# Patient Record
Sex: Male | Born: 1955 | Race: White | Hispanic: No | Smoking: Former smoker
Health system: Southern US, Community
[De-identification: ages and names within clinical notes are randomized; demographics above are authoritative.]

## PROBLEM LIST (undated history)

## (undated) DIAGNOSIS — Z9889 Other specified postprocedural states: Secondary | ICD-10-CM

## (undated) DIAGNOSIS — Z87442 Personal history of urinary calculi: Secondary | ICD-10-CM

## (undated) DIAGNOSIS — K635 Polyp of colon: Secondary | ICD-10-CM

## (undated) DIAGNOSIS — E785 Hyperlipidemia, unspecified: Secondary | ICD-10-CM

## (undated) DIAGNOSIS — I83813 Varicose veins of bilateral lower extremities with pain: Secondary | ICD-10-CM

## (undated) DIAGNOSIS — C801 Malignant (primary) neoplasm, unspecified: Secondary | ICD-10-CM

## (undated) DIAGNOSIS — N200 Calculus of kidney: Secondary | ICD-10-CM

## (undated) HISTORY — DX: Calculus of kidney: N20.0

---

## 2004-06-22 ENCOUNTER — Ambulatory Visit: Payer: Self-pay

## 2009-05-15 DIAGNOSIS — Z720 Tobacco use: Secondary | ICD-10-CM | POA: Insufficient documentation

## 2012-05-10 HISTORY — PX: HAND SURGERY: SHX662

## 2012-12-06 ENCOUNTER — Emergency Department: Payer: Self-pay | Admitting: Emergency Medicine

## 2012-12-07 ENCOUNTER — Ambulatory Visit: Payer: Self-pay | Admitting: Orthopedic Surgery

## 2013-02-15 ENCOUNTER — Ambulatory Visit: Payer: Self-pay | Admitting: Orthopedic Surgery

## 2013-03-22 ENCOUNTER — Ambulatory Visit: Payer: Self-pay | Admitting: Orthopedic Surgery

## 2014-08-30 NOTE — Op Note (Signed)
PATIENT NAME:  Brandon GrebeBOWES, Edinson T MR#:  [id number] DATE OF BIRTH:  1955-12-12  DATE OF PROCEDURE:  03/22/2013  PREOPERATIVE DIAGNOSIS:  Painful hardware, left hand.   POSTOPERATIVE DIAGNOSIS:  Painful hardware, left hand.  PROCEDURE:  Removal of deep hardware, left hand.   ANESTHESIA:  General.   SURGEON:  Kennedy BuckerMichael Avital Dancy, M.D.   DESCRIPTION OF PROCEDURE:  The patient was brought to the Operating Room and after adequate general anesthesia was obtained, the left arm was prepped and draped in the usual sterile fashion with a tourniquet applied to the upper arm.  After appropriate patient identification and timeout procedures were completed the prior incision between the second and third metacarpals was utilized.  Incision carried down through the skin and subcutaneous tissue.  Inspection revealed significant scar tissue adhering around the extensor tendons of the second and third fingers.  The extensor communis tendon of the middle finger was separated from the scar tissue first and then off of the underlying plate.  Next, the two tendons to the index finger were also released from their encasing scar tissue as well as over the plate, deep to them at the second metacarpal.  Both plates were exposed and all screws removed without difficulty along with the plates.  Fractures were healed.  The wound was irrigated and inspection revealed improved flexion of both the MCP joints of both the index and middle fingers by approximately 15 to 20 degrees following release.  Passive flexion gave 1 to 2 cm from fingertip to palm measurement.  The wounds were thoroughly irrigated.  Local anesthetic was infiltrated in the subcutaneous tissue to aid in postoperative analgesia, 10 mL of 0.5% Sensorcaine without epinephrine.  The wound was closed with 4-0 nylon.  Xeroform, 4 x 4's, Webril and a dorsal splint holding the MCPs in maximal flexion were applied.  The patient was sent to the recovery room in stable condition.    ESTIMATED BLOOD LOSS:  Minimal.   COMPLICATIONS:  None.   SPECIMEN:  None.   Hardware given to patient per his request.   TOURNIQUET TIME:  41 minutes at 250 mmHg.    ____________________________ Leitha SchullerMichael J. Ambrose Wile, MD mjm:ea D: 03/22/2013 18:52:24 ET T: 03/23/2013 03:53:29 ET JOB#: [id number]  cc: Leitha SchullerMichael J. Troi Bechtold, MD, <Dictator> Leitha SchullerMICHAEL J Nyimah Shadduck MD ELECTRONICALLY SIGNED 03/23/2013 8:01

## 2014-08-30 NOTE — Op Note (Signed)
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   PROCEDURE: Left carpal tunnel release and ulnar nerve release at the wrist, Guyon canal.   ANESTHESIA: General.   SURGEON: Kennedy BuckerMichael Careen Mauch, M.D.   DESCRIPTION OF PROCEDURE: The patient was brought to the operating room and after adequate anesthesia was obtained, the left arm was prepped and draped in the usual sterile fashion with a tourniquet applied to the upper arm. After appropriate patient identification and timeout procedures were completed, the tourniquet raised to 250 mmHg.   An incision was made in line with the ring metacarpal and the skin and subcutaneous tissue were  divided to get down to the transverse carpal ligament. Going just ulnar to this, the Guyon canal was opened and the ulnar artery and nerve followed to the abductor musculature. There did appear to be some compression in the distal portion of the canal with release obtained.   Next, the carpal tunnel was opened just radial to this level of Guyon canal with the transverse carpal ligament incised along its length. There appeared to be compression in the more distal portion of the canal with blanching of the nerve. There did appear to be complete release with the transcarpal ligament release with some vascular blush to the nerve.   The wound was irrigated and then closed with simple interrupted 4-0 nylon skin sutures, and 10 mL of 0.5% Sensorcaine without epinephrine was infiltrated for postoperative analgesia. Sterile dressing of Xeroform, 4 x 4's, Webril and Ace wrap were applied. Tourniquet time was 24 minutes at 250 mmHg. There were no complications and no specimen.   ____________________________ Leitha SchullerMichael J. Zhi Geier,  MD mjm:np D: 02/15/2013 18:54:00 ET T: 02/15/2013 22:41:51 ET JOB#: [id number]  cc: Leitha SchullerMichael J. Alekxander Isola, MD, <Dictator> Leitha SchullerMICHAEL J Matisse Roskelley MD ELECTRONICALLY SIGNED 02/16/2013 8:00

## 2014-08-30 NOTE — Op Note (Signed)
PATIENT NAME:  Brandon Shields, Brandon Shields MR#:  [id number] DATE OF BIRTH:  [birth year]  DATE OF PROCEDURE:  12/07/2012  PREOPERATIVE DIAGNOSIS: Displaced second and third metacarpal shaft fractures.   POSTOPERATIVE DIAGNOSIS: Displaced second and third metacarpal shaft fractures.   PROCEDURE: Open reduction and internal fixation of left second and third metacarpals.   ANESTHESIA: General.   SURGEON: Leitha SchullerMichael J. Asencion Guisinger, M.D.   DESCRIPTION OF PROCEDURE: The patient was brought to the operating room and after adequate anesthesia was obtained, the left arm was prepped and draped in the usual sterile fashion with a tourniquet applied to the upper arm. After appropriate patient identification and timeout procedures were completed, the tourniquet was elevated to 250 mmHg. Incision was made in between the second and third metacarpals, and the juncturae tendinum was split and subsequently repaired. The second metacarpal was approached first with stripping of these down to the bone of the shaft and then going distally. The fracture could be reduced with flexion of the MCP and IP joints. With the fracture reduced, a locking 2.5 straight plate was cut, a 5-hole plate applied and attached with K-wires. C-arm showed good visualization of the fracture line and appropriate position of the plate. A locking screw was placed on the proximal fragment and then a nonlocking screw through the most distal screw hole to compress the plate against the bone. This gave essentially anatomic alignment with minimal gap at the fracture site. The remaining screws were then filled using standard technique, drilling through the fast guide and then placing the locking screws. The initial nonlocking screw was then replaced with a locking screw to add to rigidity. Next, the third metacarpal was exposed in a similar fashion through the same incision. It was a more proximal fracture and relatively easy to align with the indirect reduction created by the  second metacarpal which was quite displaced. A 6-hole plate was applied with again K-wires through the fast guides holding the plate in place. A locking screw was placed proximally, drilling, measuring and placing the locking screw, and then a compression screw was placed through the distal part of the plate with a washer in place. Additional locking screws were then placed, including a locking screw through compression hole on the proximal end. There appeared to be essentially anatomic alignment on examination. Rotation was stable. On stressing the fifth and fourth metacarpals, there was no displacement of the fractures in these metacarpals, with AP and lateral projections showing no screw penetration into the volar aspect of the hand or into the flexor tendon. The wound was thoroughly irrigated. A 4-0 Vicryl suture was used to repair the juncturae between the extensor tendons. Of note, there was extensive soft tissue injury to the subcutaneous tissue from the prior crush injury. The wound was then closed with 4-0 Vicryl subcutaneously and 4-0 nylon for the skin in a simple interrupted fashion. The wound was thoroughly irrigated prior to this, and 0.5% Sensorcaine was infiltrated for postoperative analgesia proximal to the incision. Sterile dressings of Xeroform, 4 x 4, Webril and a volar splint were applied. Biomet 2.5 locking straight plate was used.   TOURNIQUET TIME: 94 minutes at 250 mmHg.   CONDITION: To recovery room stable.   ____________________________ Leitha SchullerMichael J. Evelyna Folker, MD mjm:gb D: 12/07/2012 23:13:33 ET Shields: 12/07/2012 23:39:06 ET JOB#: [id number]  cc: Leitha SchullerMichael J. Jari Carollo, MD, <Dictator> Leitha SchullerMICHAEL J Allisa Einspahr MD ELECTRONICALLY SIGNED 12/08/2012 12:52

## 2014-12-18 DIAGNOSIS — E785 Hyperlipidemia, unspecified: Secondary | ICD-10-CM | POA: Insufficient documentation

## 2016-10-25 ENCOUNTER — Encounter (INDEPENDENT_AMBULATORY_CARE_PROVIDER_SITE_OTHER): Payer: PRIVATE HEALTH INSURANCE | Admitting: Vascular Surgery

## 2016-11-01 ENCOUNTER — Encounter (INDEPENDENT_AMBULATORY_CARE_PROVIDER_SITE_OTHER): Payer: Self-pay | Admitting: Vascular Surgery

## 2016-11-01 ENCOUNTER — Ambulatory Visit (INDEPENDENT_AMBULATORY_CARE_PROVIDER_SITE_OTHER): Payer: PRIVATE HEALTH INSURANCE | Admitting: Vascular Surgery

## 2016-11-01 VITALS — BP 151/91 | HR 77 | Resp 16 | Ht 73.0 in | Wt 261.0 lb

## 2016-11-01 DIAGNOSIS — Z720 Tobacco use: Secondary | ICD-10-CM

## 2016-11-01 DIAGNOSIS — E785 Hyperlipidemia, unspecified: Secondary | ICD-10-CM | POA: Diagnosis not present

## 2016-11-01 DIAGNOSIS — M79604 Pain in right leg: Secondary | ICD-10-CM

## 2016-11-01 DIAGNOSIS — M79605 Pain in left leg: Secondary | ICD-10-CM

## 2016-11-01 DIAGNOSIS — I83813 Varicose veins of bilateral lower extremities with pain: Secondary | ICD-10-CM | POA: Insufficient documentation

## 2016-11-01 NOTE — Progress Notes (Signed)
Subjective:    Patient ID: Brandon Shields., male    DOB: 18-Mar-1956, 61 y.o.   MRN: [id number] Chief Complaint  Patient presents with  . New Evaluation    Claudication   Presents as a new patient referred by Dr. Quillian Quince for evaluation of "painful lower extremity". Patient endorses a history of bilateral hip pain which radiates down to his ankle. This occurs bilaterally however the left lower extremity is more affected. The patient had a recent episode where this discomfort was very intense, this prompted him to seek medical attention. During his physical, the clinician was unable to palpate his DP pulses. The patient says this discomfort worsens with activity. The patient also endorses a history of painful varicose veins located on the bilateral lower extremity. States the discomfort along his varicose veins worsens as the day prolongs. The patient job requires him to stand and walk for long periods of time. Patient denies any trauma or recent surgical intervention to his lower extremities. Patient denies any history of DVT. Patient denies any rest pain or ulceration to his lower extremity. Patient denies any fever, nausea or vomiting.   Review of Systems  Constitutional: Negative.   HENT: Negative.   Eyes: Negative.   Respiratory: Negative.   Cardiovascular: Positive for leg swelling.       Painful varicose veins  Gastrointestinal: Negative.   Endocrine: Negative.   Genitourinary: Negative.   Musculoskeletal: Negative.   Skin: Negative.   Allergic/Immunologic: Negative.   Neurological: Negative.   Hematological: Negative.   Psychiatric/Behavioral: Negative.       Objective:   Physical Exam  Constitutional: He is oriented to person, place, and time. He appears well-developed and well-nourished. No distress.  HENT:  Head: Normocephalic and atraumatic.  Eyes: Conjunctivae are normal. Pupils are equal, round, and reactive to light.  Neck: Normal range of motion.  Cardiovascular:  Normal rate, regular rhythm, normal heart sounds and intact distal pulses.   Pulses:      Radial pulses are 2+ on the right side, and 2+ on the left side.       Dorsalis pedis pulses are 0 on the right side, and 0 on the left side.       Posterior tibial pulses are 2+ on the right side, and 2+ on the left side.  Pulmonary/Chest: Effort normal.  Musculoskeletal: Normal range of motion. He exhibits edema (Mild bilateral edema noted).  Neurological: He is alert and oriented to person, place, and time.  Skin: He is not diaphoretic.  Greater than 1 cm varicose veins noted bilaterally Scattered spider veins noted bilaterally  Psychiatric: He has a normal mood and affect. His behavior is normal. Judgment and thought content normal.  Vitals reviewed.  BP (!) 151/91 (BP Location: Right Arm)   Pulse 77   Resp 16   Ht 6\' 1"  (1.854 m)   Wt 261 lb (118.4 kg)   BMI 34.43 kg/m   No past medical history on file.  Social History   Social History  . Marital status: Divorced    Spouse name: N/A  . Number of children: N/A  . Years of education: N/A   Occupational History  . Not on file.   Social History Main Topics  . Smoking status: Former Games developer  . Smokeless tobacco: Never Used     Comment: quit 31 years ago  . Alcohol use Yes     Comment: beer  . Drug use: No  . Sexual activity: Not on  file   Other Topics Concern  . Not on file   Social History Narrative  . No narrative on file   Past Surgical History:  Procedure Laterality Date  . HAND SURGERY  2014   Family History  Problem Relation Age of Onset  . Psoriasis Mother   . Diabetes Father   . COPD Father   . Diabetes Sister   . Psoriasis Brother    Allergies  Allergen Reactions  . Codeine Rash      Assessment & Plan:  Presents as a new patient referred by Dr. Quillian QuinceBliss for evaluation of "painful lower extremity". Patient endorses a history of bilateral hip pain which radiates down to his ankle. This occurs bilaterally  however the left lower extremity is more affected. The patient had a recent episode where this discomfort was very intense, this prompted him to seek medical attention. During his physical, the clinician was unable to palpate his DP pulses. The patient says this discomfort worsens with activity. The patient also endorses a history of painful varicose veins located on the bilateral lower extremity. States the discomfort along his varicose veins worsens as the day prolongs. The patient job requires him to stand and walk for long periods of time. Patient denies any trauma or recent surgical intervention to his lower extremities. Patient denies any history of DVT. Patient denies any rest pain or ulceration to his lower extremity. Patient denies any fever, nausea or vomiting.  1. Lower extremity pain, bilateral - New Patient with risk factors for PAD Patient Nicole CellaBarrens is pain with activity Able to palpate bilateral PT pulses however DP pulses are not palpable Patient's pain most likely DJD or neuropathic however order a baseline ABI to assess if any PAD is contributing peripheral artery disease and  - VAS US ABI WITH/WO TBI; Future  2. Varicose veins of both lower extremities with pain - New The patient was encouraged to wear graduated compression stockings (20-30 mmHg) on a daily basis. The patient was instructed to begin wearing the stockings first thing in the morning and removing them in the evening. The patient was instructed specifically not to sleep in the stockings. Prescription given. In addition, behavioral modification including elevation during the day will be initiated. Anti-inflammatories for pain. The patient will follow up in three months to asses conservative management.  Information on chronic venous insufficiency and compression stockings was given to the patient. The patient was instructed to call the office in the interim if any worsening edema or ulcerations to the legs, feet or toes  occurs. The patient expresses their understanding.  - VAS US LOWER EXTREMITY VENOUS REFLUX; Future  3. Hyperlipidemia, unspecified hyperlipidemia type - Stable Encouraged good control as its slows the progression of atherosclerotic disease  4. Tobacco use - Stable I have discussed (approximately 5 minutes) with the patient the role of tobacco in the pathogenesis of atherosclerosis and its effect on the progression of the disease, impact on the durability of interventions and its limitations on the formation of collateral pathways. I have recommended absolute tobacco cessation. I have discussed various options available for assistance with tobacco cessation including over the counter methods (Nicotine gum, patch and lozenges). We also discussed prescription options (Chantix, Nicotine Inhaler / Nasal Spray). The patient is not interested in pursuing any prescription tobacco cessation options at this time. The patient voices their understanding.   No current outpatient prescriptions on file prior to visit.   No current facility-administered medications on file prior to visit.  There are no Patient Instructions on file for this visit. No Follow-up on file.   Raeford Brandenburg A Roney Youtz, PA-C

## 2016-12-23 ENCOUNTER — Ambulatory Visit (INDEPENDENT_AMBULATORY_CARE_PROVIDER_SITE_OTHER): Payer: PRIVATE HEALTH INSURANCE

## 2016-12-23 ENCOUNTER — Ambulatory Visit (INDEPENDENT_AMBULATORY_CARE_PROVIDER_SITE_OTHER): Payer: PRIVATE HEALTH INSURANCE | Admitting: Vascular Surgery

## 2016-12-23 ENCOUNTER — Encounter (INDEPENDENT_AMBULATORY_CARE_PROVIDER_SITE_OTHER): Payer: Self-pay | Admitting: Vascular Surgery

## 2016-12-23 DIAGNOSIS — I83813 Varicose veins of bilateral lower extremities with pain: Secondary | ICD-10-CM

## 2016-12-23 DIAGNOSIS — I872 Venous insufficiency (chronic) (peripheral): Secondary | ICD-10-CM

## 2016-12-23 DIAGNOSIS — M79604 Pain in right leg: Secondary | ICD-10-CM

## 2016-12-23 DIAGNOSIS — M79605 Pain in left leg: Secondary | ICD-10-CM | POA: Diagnosis not present

## 2016-12-23 DIAGNOSIS — I83812 Varicose veins of left lower extremities with pain: Secondary | ICD-10-CM

## 2016-12-23 DIAGNOSIS — E785 Hyperlipidemia, unspecified: Secondary | ICD-10-CM

## 2016-12-26 NOTE — Progress Notes (Signed)
MRN : [id number]  Brandon Shields. is a 61 y.o. (Apr 12, 1956) male who presents with chief complaint of  Chief Complaint  Patient presents with  . Follow-up    CONV.ABI.BLE VEN REFLUX   .  History of Present Illness: The patient is seen for follow up of painful lower extremities. Patient notes the pain is variable and not always associated with activity.  The pain is somewhat consistent day to day occurring on most days. The patient notes the pain also occurs with standing and routinely seems worse as the day wears on. The pain has been progressive over the past several years. The patient states these symptoms are causing  a profound negative impact on quality of life and daily activities.  The patient denies rest pain or dangling of an extremity off the side of the bed during the night for relief. No open wounds or sores at this time. No history of DVT or phlebitis. No prior interventions or surgeries.  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    No outpatient prescriptions have been marked as taking for the 12/23/16 encounter (Office Visit) with Gilda Crease, Latina Craver, MD.    History reviewed. No pertinent past medical history.  Past Surgical History:  Procedure Laterality Date  . HAND SURGERY  2014    Social History Social History  Substance Use Topics  . Smoking status: Former Games developer  . Smokeless tobacco: Never Used     Comment: quit 31 years ago  . Alcohol use Yes     Comment: beer    Family History Family History  Problem Relation Age of Onset  . Psoriasis Mother   . Diabetes Father   . COPD Father   . Diabetes Sister   . Psoriasis Brother     Allergies  Allergen Reactions  . Codeine Rash     REVIEW OF SYSTEMS (Negative unless checked)  Constitutional: [] Weight loss  [] Fever  [] Chills Cardiac: [] Chest pain   [] Chest pressure   [] Palpitations   [] Shortness of breath when laying flat   [] Shortness of breath with  exertion. Vascular:  [x] Pain in legs with walking   [x] Pain in legs with standing  [] History of DVT   [] Phlebitis   [] Swelling in legs   [] Varicose veins   [] Non-healing ulcers Pulmonary:   [] Uses home oxygen   [] Productive cough   [] Hemoptysis   [] Wheeze  [] COPD   [] Asthma Neurologic:  [] Dizziness   [] Seizures   [] History of stroke   [] History of TIA  [] Aphasia   [] Vissual changes   [] Weakness or numbness in arm   [] Weakness or numbness in leg Musculoskeletal:   [] Joint swelling   [x] Joint pain   [x] Low back pain Hematologic:  [] Easy bruising  [] Easy bleeding   [] Hypercoagulable state   [] Anemic Gastrointestinal:  [] Diarrhea   [] Vomiting  [] Gastroesophageal reflux/heartburn   [] Difficulty swallowing. Genitourinary:  [] Chronic kidney disease   [] Difficult urination  [] Frequent urination   [] Blood in urine Skin:  [] Rashes   [] Ulcers  Psychological:  [] History of anxiety   []  History of major depression.  Physical Examination  There were no vitals filed for this visit. There is no height or weight on file to calculate BMI. Gen: WD/WN, NAD Head: [place]/AT, No temporalis wasting.  Ear/Nose/Throat: Hearing grossly intact, nares w/o erythema or drainage Eyes: PER, EOMI, sclera nonicteric.  Neck: Supple, no large masses.   Pulmonary:  Good air movement, no audible wheezing bilaterally,  no use of accessory muscles.  Cardiac: RRR, no JVD Vascular: Large varicosities present extensively greater than 10 mm bilaterally.  moderate venous stasis changes to the legs bilaterally.  2+ soft pitting edema Vessel Right Left  Radial Palpable Palpable  PT Not Palpable Trace Palpable  DP Trace Palpable Trace Palpable  Gastrointestinal: Non-distended. No guarding/no peritoneal signs.  Musculoskeletal: M/S 5/5 throughout.  No deformity or atrophy.  Neurologic: CN 2-12 intact. Symmetrical.  Speech is fluent. Motor exam as listed above. Psychiatric: Judgment intact, Mood & affect appropriate for pt's clinical  situation. Dermatologic: venous rashes no ulcers noted.  No changes consistent with cellulitis. Lymph : No lichenification or skin changes of chronic lymphedema.  CBC No results found for: WBC, HGB, HCT, MCV, PLT  BMET No results found for: NA, K, CL, CO2, GLUCOSE, BUN, CREATININE, CALCIUM, GFRNONAA, GFRAA CrCl cannot be calculated (No order found.).  COAG No results found for: INR, PROTIME  Radiology No results found.  Assessment/Plan 1. Lower extremity pain, bilateral Recommend:  I do not find evidence of Vascular pathology that would explain the patient's symptoms  The patient has atypical pain symptoms for vascular disease  Noninvasive studies including venous ultrasound of the legs do not identify vascular problems  The patient should continue walking and begin a more formal exercise program. The patient should continue his antiplatelet therapy and aggressive treatment of the lipid abnormalities. The patient should begin wearing graduated compression socks 15-20 mmHg strength to control her mild edema.  Patient will follow-up with me on a PRN basis  Further work-up of her lower extremity pain is deferred to the primary service     2. Varicose veins of both lower extremities with pain Recommend:  The patient is complaining of varicose veins.    I have had a long discussion with the patient regarding  varicose veins and why they cause symptoms.  Patient will begin wearing graduated compression stockings on a daily basis, beginning first thing in the morning and removing them in the evening. The patient is instructed specifically not to sleep in the stockings.    The patient  will also begin using over-the-counter analgesics such as Motrin 600 mg po TID to help control the symptoms as needed.    In addition, behavioral modification including elevation during the day will be initiated, utilizing a recliner was recommended.  The patient is also instructed to continue  exercising such as walking 4-5 times per week.  At this time the patient wishes to continue conservative therapy and is not interested in more invasive treatments such as laser ablation and sclerotherapy.  The Patient will follow up PRN if the symptoms worsen.  3. Hyperlipidemia, unspecified hyperlipidemia type Continue statin as ordered and reviewed, no changes at this time   4. Chronic venous insufficiency Recommend:  The patient is complaining of varicose veins.    I have had a long discussion with the patient regarding  varicose veins and why they cause symptoms.  Patient will begin wearing graduated compression stockings on a daily basis, beginning first thing in the morning and removing them in the evening. The patient is instructed specifically not to sleep in the stockings.    The patient  will also begin using over-the-counter analgesics such as Motrin 600 mg po TID to help control the symptoms as needed.    In addition, behavioral modification including elevation during the day will be initiated, utilizing a recliner was recommended.  The patient is also instructed to continue exercising  such as walking 4-5 times per week.  At this time the patient wishes to continue conservative therapy and is not interested in more invasive treatments such as laser ablation and sclerotherapy.  The Patient will follow up PRN if the symptoms worsen.    Levora Dredge, MD  12/26/2016 2:04 PM

## 2019-10-27 ENCOUNTER — Other Ambulatory Visit: Payer: Self-pay

## 2019-10-27 ENCOUNTER — Encounter: Payer: Self-pay | Admitting: Emergency Medicine

## 2019-10-27 ENCOUNTER — Emergency Department
Admission: EM | Admit: 2019-10-27 | Discharge: 2019-10-27 | Disposition: A | Discharge status: Home / Self Care | Payer: Commercial Managed Care - PPO | Attending: Emergency Medicine | Admitting: Emergency Medicine

## 2019-10-27 ENCOUNTER — Emergency Department: Payer: Commercial Managed Care - PPO

## 2019-10-27 DIAGNOSIS — N201 Calculus of ureter: Secondary | ICD-10-CM | POA: Diagnosis not present

## 2019-10-27 DIAGNOSIS — Z87891 Personal history of nicotine dependence: Secondary | ICD-10-CM | POA: Diagnosis not present

## 2019-10-27 DIAGNOSIS — R1031 Right lower quadrant pain: Secondary | ICD-10-CM | POA: Diagnosis present

## 2019-10-27 LAB — BASIC METABOLIC PANEL
Anion gap: 10 (ref 5–15)
BUN: 16 mg/dL (ref 8–23)
CO2: 23 mmol/L (ref 22–32)
Calcium: 9.3 mg/dL (ref 8.9–10.3)
Chloride: 105 mmol/L (ref 98–111)
Creatinine, Ser: 0.92 mg/dL (ref 0.61–1.24)
GFR calc Af Amer: >60 mL/min (ref >60)
GFR calc non Af Amer: >60 mL/min (ref >60)
Glucose, Bld: 171 mg/dL — ABNORMAL HIGH (ref 70–99)
Potassium: 4.1 mmol/L (ref 3.5–5.1)
Sodium: 138 mmol/L (ref 135–145)

## 2019-10-27 LAB — URINALYSIS, COMPLETE (UACMP) WITH MICROSCOPIC
Bacteria, UA: NONE SEEN
Bilirubin Urine: NEGATIVE
Glucose, UA: NEGATIVE mg/dL
Ketones, ur: 5 mg/dL — AB
Leukocytes,Ua: NEGATIVE
Nitrite: NEGATIVE
Protein, ur: NEGATIVE mg/dL
Specific Gravity, Urine: 1.015 (ref 1.005–1.030)
Squamous Epithelial / HPF: NONE SEEN (ref 0–5)
pH: 6 (ref 5.0–8.0)

## 2019-10-27 LAB — CBC
HCT: 46.2 % (ref 39.0–52.0)
Hemoglobin: 15.9 g/dL (ref 13.0–17.0)
MCH: 30.3 pg (ref 26.0–34.0)
MCHC: 34.4 g/dL (ref 30.0–36.0)
MCV: 88.2 fL (ref 80.0–100.0)
Platelets: 126 10*3/uL — ABNORMAL LOW (ref 150–400)
RBC: 5.24 MIL/uL (ref 4.22–5.81)
RDW: 12.5 % (ref 11.5–15.5)
WBC: 8.2 10*3/uL (ref 4.0–10.5)
nRBC: 0 % (ref 0.0–0.2)

## 2019-10-27 MED ORDER — LACTATED RINGERS IV BOLUS
1000.0000 mL | Freq: Once | INTRAVENOUS | Status: AC
  Administered 2019-10-27: 1000 mL via INTRAVENOUS

## 2019-10-27 MED ORDER — HYDROCODONE-ACETAMINOPHEN 5-325 MG PO TABS: 1.0000 | ORAL_TABLET | ORAL | 0 refills | Status: DC | PRN

## 2019-10-27 MED ORDER — ONDANSETRON 4 MG PO TBDP: 4.0000 mg | ORAL_TABLET | Freq: Three times a day (TID) | ORAL | 0 refills | Status: DC | PRN

## 2019-10-27 NOTE — ED Notes (Signed)
Pt up to use bathroom 

## 2019-10-27 NOTE — ED Triage Notes (Signed)
Pt presents to ED via [facility] from [facility] with c/o back and groin pain with sudden onset at approx 0600 this AM. Per EMS pt reported feeling like his "balls were being squeezed" and also had painful urination this morning.    20g to L AC 224CBG 176/94 62HR

## 2019-10-27 NOTE — ED Provider Notes (Signed)
[facility] Emergency Department Provider Note   ____________________________________________   First MD Initiated Contact with Patient 10/27/19 1049     (approximate)  I have reviewed the triage vital signs and the nursing notes.   HISTORY  Chief Complaint Back Pain and Groin Pain    HPI Brandon Shields. is a 64 y.o. male with past medical history of hyperlipidemia and smoking who presents to the ED complaining of flank and groin pain.  Patient reports that he had sudden onset of sharp pain in his right flank that radiated towards his right groin around 6 AM this morning.  Pain will come on and be severe for about 30 minutes or so before resolving.  Pain can shoot all the way down to his testicles, where it feels like he is being squeezed when the pain is severe.  He denies any dysuria or hematuria, has felt nauseous and sweaty with these episodes, but has not vomited or had any changes in his bowel movements.  He had similar symptoms a couple of weeks ago, but was never evaluated at that time and denies any history of kidney stones.  He has not noticed any swelling or tender areas to his groin.  He is pain-free at this time.        History reviewed. No pertinent past medical history.  Patient Active Problem List   Diagnosis Date Noted  . 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 Lower extremity pain, bilateral 11/01/2016  . HLD (hyperlipidemia) 12/18/2014  . Tobacco use 05/15/2009    Past Surgical History:  Procedure Laterality Date  . HAND SURGERY  2014    Prior to Admission medications   Medication Sig Start Date End Date Taking? Authorizing Provider  HYDROcodone-acetaminophen (NORCO/VICODIN) 5-325 MG tablet Take 1 tablet by mouth every 4 (four) hours as needed for moderate pain. 10/27/19 10/26/20  Chesley Noon, MD  ondansetron (ZOFRAN ODT) 4 MG disintegrating tablet Take 1 tablet (4 mg total) by mouth every 8 (eight)  hours as needed for nausea or vomiting. 10/27/19   Chesley Noon, MD    Allergies Codeine  Family History  Problem Relation Age of Onset  . Psoriasis Mother   . Diabetes Father   . COPD Father   . Diabetes Sister   . Psoriasis Brother     Social History Social History   Tobacco Use  . Smoking status: Former Games developer  . Smokeless tobacco: Never Used  . Tobacco comment: quit 31 years ago  Substance Use Topics  . Alcohol use: Yes    Comment: beer  . Drug use: No    Review of Systems  Constitutional: No fever/chills Eyes: No visual changes. ENT: No sore throat. Cardiovascular: Denies chest pain. Respiratory: Denies shortness of breath. Gastrointestinal: No abdominal pain.  No nausea, no vomiting.  No diarrhea.  No constipation.  Positive for flank pain. Genitourinary: Negative for dysuria.  Positive for groin pain. Musculoskeletal: Negative for back pain. Skin: Negative for rash. Neurological: Negative for headaches, focal weakness or numbness.  ____________________________________________   PHYSICAL EXAM:  VITAL SIGNS: ED Triage Vitals  Enc Vitals Group     BP 10/27/19 0957 137/85     Pulse Rate 10/27/19 0957 79     Resp 10/27/19 0957 17     Temp 10/27/19 0957 97.7 F (36.5 C)     Temp Source 10/27/19 0957 Oral     SpO2 10/27/19 0957 96 %  Weight 10/27/19 0958 235 lb (106.6 kg)     Height 10/27/19 0958 6\' 1"  (1.854 m)     Head Circumference --      Peak Flow --      Pain Score --      Pain Loc --      Pain Edu? --      Excl. in GC? --     Constitutional: Alert and oriented. Eyes: Conjunctivae are normal. Head: Atraumatic. Nose: No congestion/rhinnorhea. Mouth/Throat: Mucous membranes are moist. Neck: Normal ROM Cardiovascular: Normal rate, regular rhythm. Grossly normal heart sounds. Respiratory: Normal respiratory effort.  No retractions. Lungs CTAB. Gastrointestinal: Soft and nontender. No distention.  No CVA tenderness  bilaterally. Genitourinary: No testicular tenderness or lesions noted.  No inguinal hernias noted bilaterally. Musculoskeletal: No lower extremity tenderness nor edema. Neurologic:  Normal speech and language. No gross focal neurologic deficits are appreciated. Skin:  Skin is warm, dry and intact. No rash noted. Psychiatric: Mood and affect are normal. Speech and behavior are normal.  ____________________________________________   LABS (all labs ordered are listed, but only abnormal results are displayed)  Labs Reviewed  URINALYSIS, COMPLETE (UACMP) WITH MICROSCOPIC - Abnormal; Notable for the following components:      Result Value   Color, Urine YELLOW (*)    APPearance CLEAR (*)    Hgb urine dipstick SMALL (*)    Ketones, ur 5 (*)    All other components within normal limits  BASIC METABOLIC PANEL - Abnormal; Notable for the following components:   Glucose, Bld 171 (*)    All other components within normal limits  CBC - Abnormal; Notable for the following components:   Platelets 126 (*)    All other components within normal limits    PROCEDURES  Procedure(s) performed (including Critical Care):  Procedures   ____________________________________________   INITIAL IMPRESSION / ASSESSMENT AND PLAN / ED COURSE       64 year old male with history of hyperlipidemia and smoking presents to the ED with acute onset of sharp pain radiating from his right flank to his groin coming in waves since this morning.  Episodes of pain are severe when present, but have currently resolved and he has no abdominal tenderness, CVA tenderness, or groin tenderness.  No inguinal hernia or scrotal lesions noted.  At this point, I suspect his symptoms are related to a kidney stone and we will further assess with CT scan.  Lab work thus far is unremarkable, UA pending.  CT scan shows nonobstructing stone at right UVJ, likely the source of patient's pain.  UA shows no evidence of infection and  patient remains pain-free at this time.  He is appropriate for discharge home with outpatient follow-up with urology.  We will prescribe Percocet and Zofran for use as needed and he was counseled to return to the ED for new worsening symptoms, patient agrees with plan.      ____________________________________________   FINAL CLINICAL IMPRESSION(S) / ED DIAGNOSES  Final diagnoses:  Ureterolithiasis     ED Discharge Orders         Ordered    HYDROcodone-acetaminophen (NORCO/VICODIN) 5-325 MG tablet  Every 4 hours PRN     Discontinue  Reprint     10/27/19 1313    ondansetron (ZOFRAN ODT) 4 MG disintegrating tablet  Every 8 hours PRN     Discontinue  Reprint     10/27/19 1313           Note:  This document was  prepared using Systems analyst and may include unintentional dictation errors.   Blake Divine, MD 10/27/19 1341

## 2019-11-05 ENCOUNTER — Other Ambulatory Visit: Payer: Self-pay | Admitting: *Deleted

## 2019-11-05 DIAGNOSIS — N201 Calculus of ureter: Secondary | ICD-10-CM

## 2019-11-05 NOTE — Addendum Note (Signed)
Addended by: Sueanne Margarita on: 11/05/2019 03:24 PM   Modules accepted: Orders

## 2019-11-06 ENCOUNTER — Ambulatory Visit (INDEPENDENT_AMBULATORY_CARE_PROVIDER_SITE_OTHER): Payer: Commercial Managed Care - PPO | Admitting: Urology

## 2019-11-06 ENCOUNTER — Encounter: Payer: Self-pay | Admitting: Urology

## 2019-11-06 ENCOUNTER — Other Ambulatory Visit: Payer: Self-pay

## 2019-11-06 ENCOUNTER — Other Ambulatory Visit
Admission: RE | Admit: 2019-11-06 | Discharge: 2019-11-06 | Disposition: A | Discharge status: Home / Self Care | Payer: Commercial Managed Care - PPO | Attending: Urology | Admitting: Urology

## 2019-11-06 VITALS — BP 170/91 | HR 62 | Ht 73.0 in | Wt 243.0 lb

## 2019-11-06 DIAGNOSIS — N201 Calculus of ureter: Secondary | ICD-10-CM | POA: Insufficient documentation

## 2019-11-06 LAB — URINALYSIS, COMPLETE (UACMP) WITH MICROSCOPIC
Bacteria, UA: NONE SEEN
Bilirubin Urine: NEGATIVE
Glucose, UA: NEGATIVE mg/dL
Hgb urine dipstick: NEGATIVE
Ketones, ur: NEGATIVE mg/dL
Leukocytes,Ua: NEGATIVE
Nitrite: NEGATIVE
Protein, ur: NEGATIVE mg/dL
Specific Gravity, Urine: 1.015 (ref 1.005–1.030)
pH: 6 (ref 5.0–8.0)

## 2019-11-06 NOTE — Patient Instructions (Signed)
Dietary Guidelines to Help Prevent Kidney Stones Kidney stones are deposits of minerals and salts that form inside your kidneys. Your risk of developing kidney stones may be greater depending on your diet, your lifestyle, the medicines you take, and whether you have certain medical conditions. Most people can reduce their chances of developing kidney stones by following the instructions below. Depending on your overall health and the type of kidney stones you tend to develop, your dietitian may give you more specific instructions. What are tips for following this plan? Reading food labels  Choose foods with "no salt added" or "low-salt" labels. Limit your sodium intake to less than 1500 mg per day.  Choose foods with calcium for each meal and snack. Try to eat about 300 mg of calcium at each meal. Foods that contain 200-500 mg of calcium per serving include: ? 8 oz (237 ml) of milk, fortified nondairy milk, and fortified fruit juice. ? 8 oz (237 ml) of kefir, yogurt, and soy yogurt. ? 4 oz (118 ml) of tofu. ? 1 oz of cheese. ? 1 cup (300 g) of dried figs. ? 1 cup (91 g) of cooked broccoli. ? 1-3 oz can of sardines or mackerel.  Most people need 1000 to 1500 mg of calcium each day. Talk to your dietitian about how much calcium is recommended for you. Shopping  Buy plenty of fresh fruits and vegetables. Most people do not need to avoid fruits and vegetables, even if they contain nutrients that may contribute to kidney stones.  When shopping for convenience foods, choose: ? Whole pieces of fruit. ? Premade salads with dressing on the side. ? Low-fat fruit and yogurt smoothies.  Avoid buying frozen meals or prepared deli foods.  Look for foods with live cultures, such as yogurt and kefir. Cooking  Do not add salt to food when cooking. Place a salt shaker on the table and allow each person to add his or her own salt to taste.  Use vegetable protein, such as beans, textured vegetable  protein (TVP), or tofu instead of meat in pasta, casseroles, and soups. Meal planning   Eat less salt, if told by your dietitian. To do this: ? Avoid eating processed or premade food. ? Avoid eating fast food.  Eat less animal protein, including cheese, meat, poultry, or fish, if told by your dietitian. To do this: ? Limit the number of times you have meat, poultry, fish, or cheese each week. Eat a diet free of meat at least 2 days a week. ? Eat only one serving each day of meat, poultry, fish, or seafood. ? When you prepare animal protein, cut pieces into small portion sizes. For most meat and fish, one serving is about the size of one deck of cards.  Eat at least 5 servings of fresh fruits and vegetables each day. To do this: ? Keep fruits and vegetables on hand for snacks. ? Eat 1 piece of fruit or a handful of berries with breakfast. ? Have a salad and fruit at lunch. ? Have two kinds of vegetables at dinner.  Limit foods that are high in a substance called oxalate. These include: ? Spinach. ? Rhubarb. ? Beets. ? Potato chips and french fries. ? Nuts.  If you regularly take a diuretic medicine, make sure to eat at least 1-2 fruits or vegetables high in potassium each day. These include: ? Avocado. ? Banana. ? Orange, prune, carrot, or tomato juice. ? Baked potato. ? Cabbage. ? Beans and split   peas. General instructions   Drink enough fluid to keep your urine clear or pale yellow. This is the most important thing you can do.  Talk to your health care provider and dietitian about taking daily supplements. Depending on your health and the cause of your kidney stones, you may be advised: ? Not to take supplements with vitamin C. ? To take a calcium supplement. ? To take a daily probiotic supplement. ? To take other supplements such as magnesium, fish oil, or vitamin B6.  Take all medicines and supplements as told by your health care provider.  Limit alcohol intake to no  more than 1 drink a day for nonpregnant women and 2 drinks a day for men. One drink equals 12 oz of beer, 5 oz of wine, or 1 oz of hard liquor.  Lose weight if told by your health care provider. Work with your dietitian to find strategies and an eating plan that works best for you. What foods are not recommended? Limit your intake of the following foods, or as told by your dietitian. Talk to your dietitian about specific foods you should avoid based on the type of kidney stones and your overall health. Grains Breads. Bagels. Rolls. Baked goods. Salted crackers. Cereal. Pasta. Vegetables Spinach. Rhubarb. Beets. Canned vegetables. Pickles. Olives. Meats and other protein foods Nuts. Nut butters. Large portions of meat, poultry, or fish. Salted or cured meats. Deli meats. Hot dogs. Sausages. Dairy Cheese. Beverages Regular soft drinks. Regular vegetable juice. Seasonings and other foods Seasoning blends with salt. Salad dressings. Canned soups. Soy sauce. Ketchup. Barbecue sauce. Canned pasta sauce. Casseroles. Pizza. Lasagna. Frozen meals. Potato chips. French fries. Summary  You can reduce your risk of kidney stones by making changes to your diet.  The most important thing you can do is drink enough fluid. You should drink enough fluid to keep your urine clear or pale yellow.  Ask your health care provider or dietitian how much protein from animal sources you should eat each day, and also how much salt and calcium you should have each day. This information is not intended to replace advice given to you by your health care provider. Make sure you discuss any questions you have with your health care provider. Document Revised: 08/16/2018 Document Reviewed: 04/06/2016 Elsevier Patient Education  2020 [employer]  

## 2019-11-06 NOTE — Progress Notes (Signed)
   11/06/19 11:39 AM   Brandon Shields. 06/12/1955 [id number]  CC: Right ureteral stone  HPI: I saw [name] in urology clinic today for a 3 mm right ureteral stone.  He is a very healthy 64 year old male with no prior history of stones who presented to the ED on 10/27/2019 with severe right groin pain, pelvic pressure, and urinary symptoms.  Urinalysis showed microscopic hematuria but no evidence of infection, and CT showed a 3 mm right distal ureteral stone without significant hydronephrosis.  There were small nonobstructing lower pole stones bilaterally.  He was discharged with pain medications.  He reports his right-sided groin pain and bladder symptoms have since completely resolved, and he thinks he passed his stone.  He was not straining his urine.  He denies any hematuria or other urinary symptoms.  Urinalysis today benign with 0-5 WBCs, 0-5 RBCs, no bacteria, nitrite negative.   Surgical History: Past Surgical History:  Procedure Laterality Date  . HAND SURGERY  2014    Family History: Family History  Problem Relation Age of Onset  . Psoriasis Mother   . Diabetes Father   . COPD Father   . Diabetes Sister   . Psoriasis Brother     Social History:  reports that he has quit smoking. He has never used smokeless tobacco. He reports current alcohol use. He reports that he does not use drugs.  Physical Exam: BP (!) 170/91   Pulse 62   Ht 6\' 1"  (1.854 m)   Wt 243 lb (110.2 kg)   BMI 32.06 kg/m    Constitutional:  Alert and oriented, No acute distress. Cardiovascular: No clubbing, cyanosis, or edema. Respiratory: Normal respiratory effort, no increased work of breathing. GI: Abdomen is soft, nontender, nondistended, no abdominal masses GU: No CVA tenderness  Laboratory Data: Reviewed, see HPI  Pertinent Imaging: I have personally reviewed the CT, see HPI for details  Assessment & Plan:   In summary, he is a healthy 64 year old male with no prior history of  nephrolithiasis who presented to the ED on 10/27/2019 with right sided renal colic and a 3 mm right distal ureteral stone.  His symptoms have since resolved, and urinalysis is completely benign today with no microscopic hematuria, indicating he most likely passed his stone spontaneously.  He has small nonobstructing lower pole stones bilaterally.  We discussed general stone prevention strategies including adequate hydration with goal of producing 2.5 L of urine daily, increasing citric acid intake, increasing calcium intake during high oxalate meals, minimizing animal protein, and decreasing salt intake. Information about dietary recommendations given today.   RTC 1 year with KUB prior  10/29/2019, MD 11/06/2019  [facility] [address] [place], [place] [phone]

## 2020-09-08 ENCOUNTER — Ambulatory Visit: Payer: Commercial Managed Care - PPO | Admitting: Dermatology

## 2020-11-03 ENCOUNTER — Ambulatory Visit
Admission: RE | Admit: 2020-11-03 | Discharge: 2020-11-03 | Disposition: A | Discharge status: Home / Self Care | Payer: Commercial Managed Care - PPO | Source: Ambulatory Visit | Attending: Urology | Admitting: Urology

## 2020-11-03 ENCOUNTER — Other Ambulatory Visit: Payer: Self-pay

## 2020-11-03 ENCOUNTER — Ambulatory Visit
Admission: RE | Admit: 2020-11-03 | Discharge: 2020-11-03 | Disposition: A | Discharge status: Home / Self Care | Payer: Commercial Managed Care - PPO | Attending: Urology | Admitting: Urology

## 2020-11-03 DIAGNOSIS — N201 Calculus of ureter: Secondary | ICD-10-CM | POA: Insufficient documentation

## 2020-11-04 ENCOUNTER — Ambulatory Visit (INDEPENDENT_AMBULATORY_CARE_PROVIDER_SITE_OTHER): Payer: Commercial Managed Care - PPO | Admitting: Urology

## 2020-11-04 ENCOUNTER — Encounter: Payer: Self-pay | Admitting: Urology

## 2020-11-04 VITALS — BP 140/80 | HR 74 | Ht 73.0 in | Wt 262.8 lb

## 2020-11-04 DIAGNOSIS — N200 Calculus of kidney: Secondary | ICD-10-CM

## 2020-11-04 DIAGNOSIS — Z125 Encounter for screening for malignant neoplasm of prostate: Secondary | ICD-10-CM

## 2020-11-04 NOTE — Progress Notes (Signed)
   11/04/2020 9:05 AM   Brandon Shields. 1955/11/18 [id number]  Reason for visit: Follow up nephrolithiasis, discuss PSA screening  HPI: I saw [name] back in clinic for the above issues.  He is a 65 year old male who spontaneously passed a small right ureteral stone in June 2021.  He has had no problems with stone since that time.  On CT at that time, he had 2 small left nonobstructing renal stones and a small right renal stone.  I personally viewed and interpreted his KUB today that shows stable small nonobstructing stones with no significant changes.  He denies any gross hematuria or significant urinary symptoms. We discussed general stone prevention strategies including adequate hydration with goal of producing 2.5 L of urine daily, increasing citric acid intake, increasing calcium intake during high oxalate meals, minimizing animal protein, and decreasing salt intake. Information about dietary recommendations given today.   In terms of PSA screening, he thinks he had a normal PSA in 2018, but this not available to me in the records.  He has no family history of prostate cancer.  He has an upcoming visit with his PCP in September, and is interested in learning more about PSA screening.  We reviewed the AUA guidelines and the risks and benefits of PSA screening, and he does fall in the 42-70 age group where screening would be recommended every 1 to 2 years.  He will request a PSA from his PCP in September, and if this is elevated contact us.  -Stone prevention strategies discussed -PSA with PCP in September '22 -Follow-up with urology as needed    Sondra Come, MD  [facility] [address] [place], [place] [address] [phone]

## 2020-11-04 NOTE — Patient Instructions (Addendum)
Dietary Guidelines to Help Prevent Kidney Stones Kidney stones are deposits of minerals and salts that form inside your kidneys. Your risk of developing kidney stones may be greater depending on your diet, your lifestyle, the medicines you take, and whether you have certain medical conditions. Most people can lower their chances of developing kidney stones by following the instructions below. Your dietitian may give you more specific instructions depending on your overall health and the type of kidney stones youtend to develop. What are tips for following this plan? Reading food labels  Choose foods with "no salt added" or "low-salt" labels. Limit your salt (sodium) intake to less than 1,500 mg a day. Choose foods with calcium for each meal and snack. Try to eat about 300 mg of calcium at each meal. Foods that contain 200-500 mg of calcium a serving include: 8 oz (237 mL) of milk, calcium-fortifiednon-dairy milk, and calcium-fortifiedfruit juice. Calcium-fortified means that calcium has been added to these drinks. 8 oz (237 mL) of kefir, yogurt, and soy yogurt. 4 oz (114 g) of tofu. 1 oz (28 g) of cheese. 1 cup (150 g) of dried figs. 1 cup (91 g) of cooked broccoli. One 3 oz (85 g) can of sardines or mackerel. Most people need 1,000-1,500 mg of calcium a day. Talk to your dietitian abouthow much calcium is recommended for you. Shopping Buy plenty of fresh fruits and vegetables. Most people do not need to avoid fruits and vegetables, even if these foods contain nutrients that may contribute to kidney stones. When shopping for convenience foods, choose: Whole pieces of fruit. Pre-made salads with dressing on the side. Low-fat fruit and yogurt smoothies. Avoid buying frozen meals or prepared deli foods. These can be high in sodium. Look for foods with live cultures, such as yogurt and kefir. Choose high-fiber grains, such as whole-wheat breads, oat bran, and wheat cereals. Cooking Do not add  salt to food when cooking. Place a salt shaker on the table and allow each person to add his or her own salt to taste. Use vegetable protein, such as beans, textured vegetable protein (TVP), or tofu, instead of meat in pasta, casseroles, and soups. Meal planning Eat less salt, if told by your dietitian. To do this: Avoid eating processed or pre-made food. Avoid eating fast food. Eat less animal protein, including cheese, meat, poultry, or fish, if told by your dietitian. To do this: Limit the number of times you have meat, poultry, fish, or cheese each week. Eat a diet free of meat at least 2 days a week. Eat only one serving each day of meat, poultry, fish, or seafood. When you prepare animal protein, cut pieces into small portion sizes. For most meat and fish, one serving is about the size of the palm of your hand. Eat at least five servings of fresh fruits and vegetables each day. To do this: Keep fruits and vegetables on hand for snacks. Eat one piece of fruit or a handful of berries with breakfast. Have a salad and fruit at lunch. Have two kinds of vegetables at dinner. Limit foods that are high in a substance called oxalate. These include: Spinach (cooked), rhubarb, beets, sweet potatoes, and Swiss chard. Peanuts. Potato chips, french fries, and baked potatoes with skin on. Nuts and nut products. Chocolate. If you regularly take a diuretic medicine, make sure to eat at least 1 or 2 servings of fruits or vegetables that are high in potassium each day. These include: Avocado. Banana. Orange, prune, carrot, or  tomato juice. Baked potato. Cabbage. Beans and split peas. Lifestyle  Drink enough fluid to keep your urine pale yellow. This is the most important thing you can do. Spread your fluid intake throughout the day. If you drink alcohol: Limit how much you use to: 0-1 drink a day for women who are not pregnant. 0-2 drinks a day for men. Be aware of how much alcohol is in your  drink. In the [place] one drink equals one 12 oz bottle of beer (355 mL), one 5 oz glass of wine (148 mL), or one 1 oz glass of hard liquor (44 mL). Lose weight if told by your health care provider. Work with your dietitian to find an eating plan and weight loss strategies that work best for you.  General information Talk to your health care provider and dietitian about taking daily supplements. You may be told the following depending on your health and the cause of your kidney stones: Not to take supplements with vitamin C. To take a calcium supplement. To take a daily probiotic supplement. To take other supplements such as magnesium, fish oil, or vitamin B6. Take over-the-counter and prescription medicines only as told by your health care provider. These include supplements. What foods should I limit? Limit your intake of the following foods, or eat them as told by your dietitian. Vegetables Spinach. Rhubarb. Beets. Canned vegetables. Rosita Fire. Olives. Baked potatoeswith skin. Grains Wheat bran. Baked goods. Salted crackers. Cereals high in sugar. Meats and other proteins Nuts. Nut butters. Large portions of meat, poultry, or fish. Salted, precooked,or cured meats, such as sausages, meat loaves, and hot dogs. Dairy Cheese. Beverages Regular soft drinks. Regular vegetable juice. Seasonings and condiments Seasoning blends with salt. Salad dressings. Soy sauce. Ketchup. Barbecue sauce. Other foods Canned soups. Canned pasta sauce. Casseroles. Pizza. Lasagna. Frozen meals.Potato chips. [place] fries. The items listed above may not be a complete list of foods and beverages you should limit. Contact a dietitian for more information. What foods should I avoid? Talk to your dietitian about specific foods you should avoid based on the typeof kidney stones you have and your overall health. Fruits Grapefruit. The item listed above may not be a complete list of foods and beverages you should avoid.  Contact a dietitian for more information. Summary Kidney stones are deposits of minerals and salts that form inside your kidneys. You can lower your risk of kidney stones by making changes to your diet. The most important thing you can do is drink enough fluid. Drink enough fluid to keep your urine pale yellow. Talk to your dietitian about how much calcium you should have each day, and eat less salt and animal protein as told by your dietitian. This information is not intended to replace advice given to you by your health care provider. Make sure you discuss any questions you have with your healthcare provider. Document Revised: 04/19/2019 Document Reviewed: 04/19/2019 Elsevier Patient Education  2022 [employer] Inc.  Prostate Cancer Screening  Prostate cancer screening is a test that is done to check for the presence of prostate cancer in men. The prostate gland is a walnut-sized gland that is located below the bladder and in front of the rectum in males. The function of the prostate is to add fluid to semen during ejaculation. Prostate cancer isthe second most common type of cancer in men. Who should have prostate cancer screening?  Screening recommendations vary based on age and other risk factors. Screening is recommended if: You are older than  age 75. If you are age 41-69, talk with your health care provider about your need for screening and how often screening should be done. Because most prostate cancers are slow growing and will not cause death, screening is generally reserved in this age group for men who have a 10-15-year life expectancy. You are younger than age 3, and you have these risk factors: Being a black male or a male of African descent. Having a father, brother, or uncle who has been diagnosed with prostate cancer. The risk is higher if your family member's cancer occurred at an early age. Screening is not recommended if: You are younger than age 26. You are between the ages  of 16 and 33 and you have no risk factors. You are 22 years of age or older. At this age, the risks that screening can cause are greater than the benefits that it may provide. If you are at high risk for prostate cancer, your health care provider may recommend that you have screenings more often or that you start screening at ayounger age. How is screening for prostate cancer done? The recommended prostate cancer screening test is a blood test called the prostate-specific antigen (PSA) test. PSA is a protein that is made in the prostate. As you age, your prostate naturally produces more PSA. Abnormally high PSA levels may be caused by: Prostate cancer. An enlarged prostate that is not caused by cancer (benign prostatic hyperplasia, BPH). This condition is very common in older men. A prostate gland infection (prostatitis). Depending on the PSA results, you may need more tests, such as: A physical exam to check the size of your prostate gland. Blood and imaging tests. A procedure to remove tissue samples from your prostate gland for testing (biopsy). What are the benefits of prostate cancer screening? Screening can help to identify cancer at an early stage, before symptoms start and when the cancer can be treated more easily. There is a small chance that screening may lower your risk of dying from prostate cancer. The chance is small because prostate cancer is a slow-growing cancer, and most men with prostate cancer die from a different cause. What are the risks of prostate cancer screening? The main risk of prostate cancer screening is diagnosing and treating prostate cancer that would never have caused any symptoms or problems. This is called overdiagnosisand overtreatment. PSA screening cannot tell you if your PSA is high due to cancer or a different cause. A prostate biopsy is the only procedure to diagnose prostate cancer. Even the results of a biopsy may not tell you if your cancer needs to be  treated. Slow-growing prostate cancer may not need any treatment other than monitoring, so diagnosing and treating it may causeunnecessary stress or other side effects. A prostate biopsy may also cause: Infection or fever. A false negative. This is a result that shows that you do not have prostate cancer when you actually do have prostate cancer. Questions to ask your health care provider When should I start prostate cancer screening? What is my risk for prostate cancer? How often do I need screening? What type of screening tests do I need? How do I get my test results? What do my results mean? Do I need treatment? Where to find more information The American Cancer Society: www.cancer.org American Urological Association: www.auanet.org Contact a health care provider if: You have difficulty urinating. You have pain when you urinate or ejaculate. You have blood in your urine or semen. You have pain  in your back or in the area of your prostate. Summary Prostate cancer is a common type of cancer in men. The prostate gland is located below the bladder and in front of the rectum. This gland adds fluid to semen during ejaculation. Prostate cancer screening may identify cancer at an early stage, when the cancer can be treated more easily. The prostate-specific antigen (PSA) test is the recommended screening test for prostate cancer. Discuss the risks and benefits of prostate cancer screening with your health care provider. If you are age 70 or older, the risks that screening can cause are greater than the benefits that it may provide. This information is not intended to replace advice given to you by your health care provider. Make sure you discuss any questions you have with your healthcare provider. Document Revised: 04/24/2020 Document Reviewed: 12/07/2018 Elsevier Patient Education  2022 [employer].

## 2021-02-05 ENCOUNTER — Other Ambulatory Visit (INDEPENDENT_AMBULATORY_CARE_PROVIDER_SITE_OTHER): Payer: Self-pay

## 2021-02-05 DIAGNOSIS — Z1211 Encounter for screening for malignant neoplasm of colon: Secondary | ICD-10-CM

## 2021-02-05 MED ORDER — PEG 3350-KCL-NA BICARB-NACL 420 G PO SOLR: 4000.0000 mL | Freq: Once | ORAL | 0 refills | Status: AC

## 2021-02-05 NOTE — Progress Notes (Signed)
Gastroenterology Pre-Procedure Review  Request Date: 02/19/2021 Requesting Physician: Dr. Tobi Bastos  PATIENT REVIEW QUESTIONS: The patient responded to the following health history questions as indicated:    1. Are you having any GI issues? no 2. Do you have a personal history of Polyps? yes (SOME REMOVED LAST COLONOSCOPY) 3. Do you have a family history of Colon Cancer or Polyps? no 4. Diabetes Mellitus? no 5. Joint replacements in the past 12 months?no 6. Major health problems in the past 3 months?no 7. Any artificial heart valves, MVP, or defibrillator?no    MEDICATIONS & ALLERGIES:    Patient reports the following regarding taking any anticoagulation/antiplatelet therapy:   Plavix, Coumadin, Eliquis, Xarelto, Lovenox, Pradaxa, Brilinta, or Effient? no Aspirin? no  Patient confirms/reports the following medications:  No current outpatient medications on file.   No current facility-administered medications for this visit.    Patient confirms/reports the following allergies:  Allergies  Allergen Reactions   Codeine Rash and Itching    No orders of the defined types were placed in this encounter.   AUTHORIZATION INFORMATION Primary Insurance: 1D#: Group #:  Secondary Insurance: 1D#: Group #:  SCHEDULE INFORMATION: Date: 02/19/2021 Time: Location: [facility]

## 2021-02-11 ENCOUNTER — Encounter: Payer: Self-pay | Admitting: Family Medicine

## 2021-02-17 ENCOUNTER — Telehealth: Payer: Self-pay

## 2021-02-17 NOTE — Telephone Encounter (Signed)
Pt. Calling to cancel colonoscopy. He said there was never any prep sent and he said he will just call back and reschedule later

## 2021-02-17 NOTE — Telephone Encounter (Signed)
PATIENT CALLED LEFT MESSAGE TO CANCEL PROCEDURE FOR COLONOSCOPY AND WILL CALL us BACK WHEN HE IS READY TO RESCHEDULE HE SAYS NO PREP BUT PREP WAS SENT 02/05/2021  TO HIS PHARMACY

## 2021-02-19 ENCOUNTER — Ambulatory Visit
Admission: RE | Admit: 2021-02-19 | Payer: Commercial Managed Care - PPO | Source: Home / Self Care | Admitting: Gastroenterology

## 2021-02-19 ENCOUNTER — Encounter: Admission: RE | Payer: Self-pay | Source: Home / Self Care

## 2021-02-19 SURGERY — COLONOSCOPY WITH PROPOFOL
Anesthesia: General

## 2021-10-14 IMAGING — CT CT RENAL STONE PROTOCOL
2 of 4 series · 16 of 46 positions shown, 18 images · non-contrast
Comparison: None.

CLINICAL DATA: Flank pain, painful urination

EXAM:
CT ABDOMEN AND PELVIS WITHOUT CONTRAST
TECHNIQUE: Multidetector CT imaging of the abdomen and pelvis was performed
following the standard protocol without IV contrast.

[Series 2: stone full standard · axial · 0.84mm/px · z∈[-917,-372]mm · 13 of 119 slices shown, 15 images]
[im 5/119  soft-tissue]
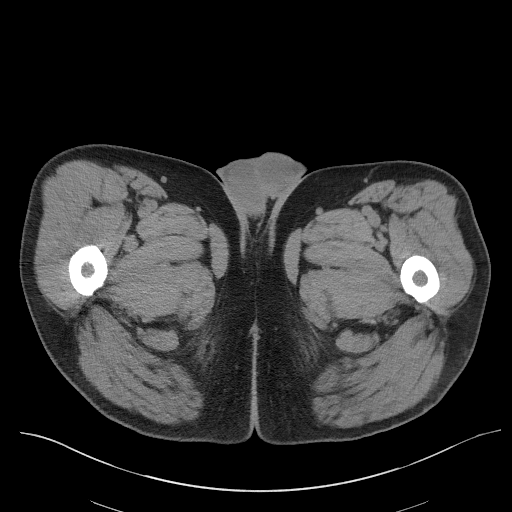
[im 5/119  bone]
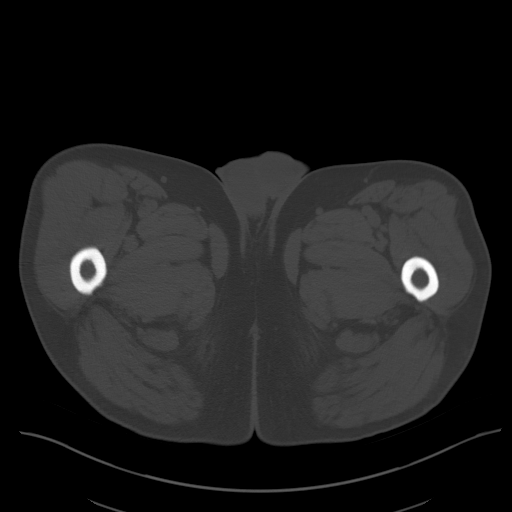
[im 15/119  soft-tissue]
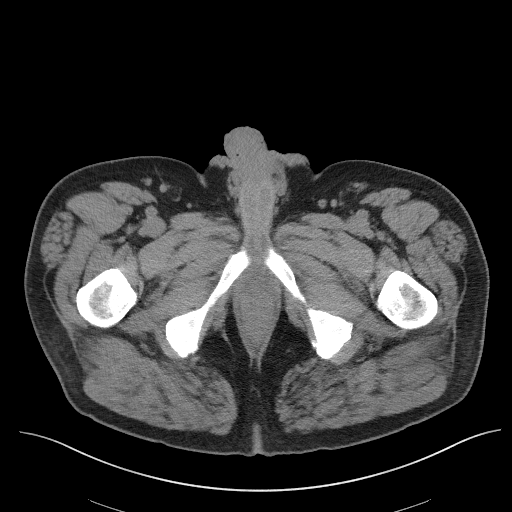
[im 25/119  soft-tissue]
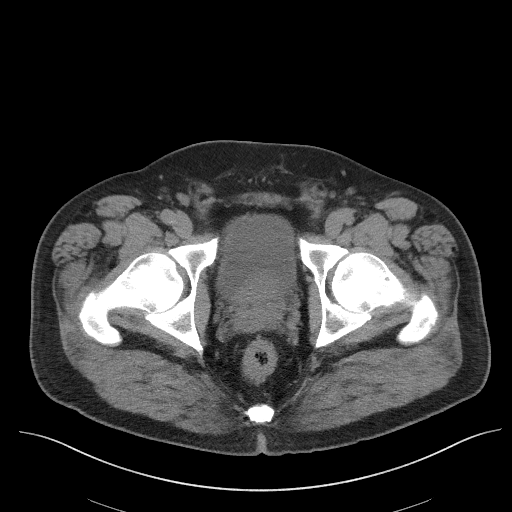
[im 35/119  soft-tissue]
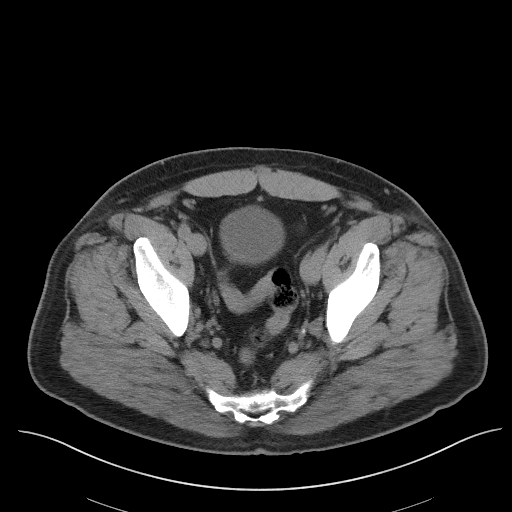
[im 40/119  soft-tissue]
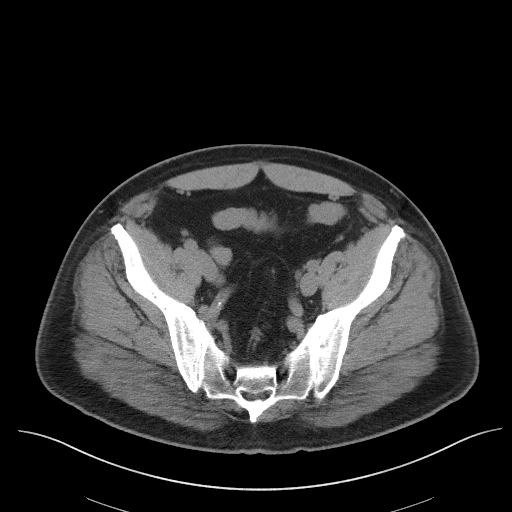
[im 50/119  soft-tissue]
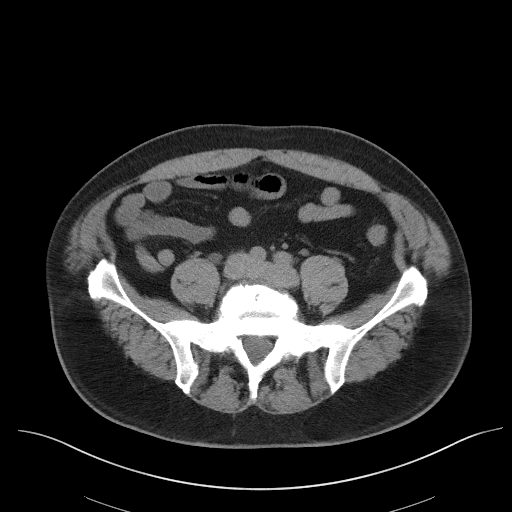
[im 60/119  soft-tissue]
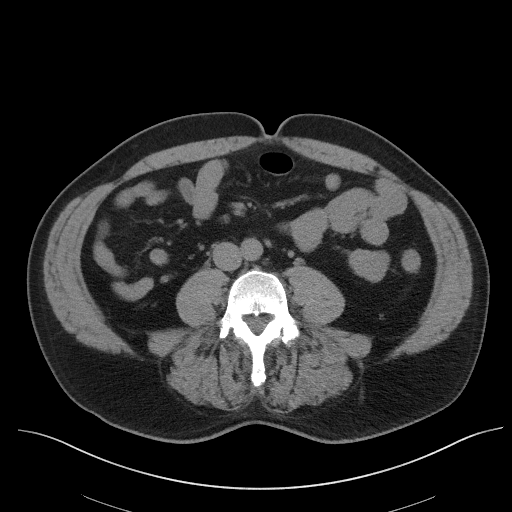
[im 69/119  soft-tissue]
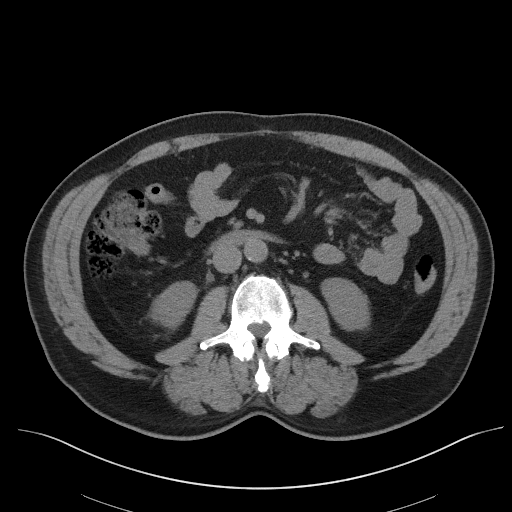
[im 79/119  soft-tissue]
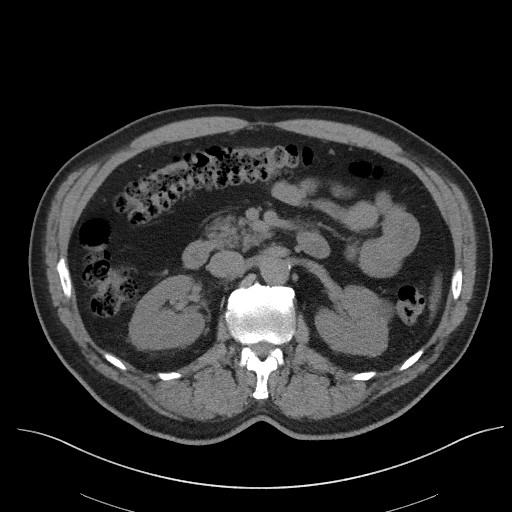
[im 79/119  bone]
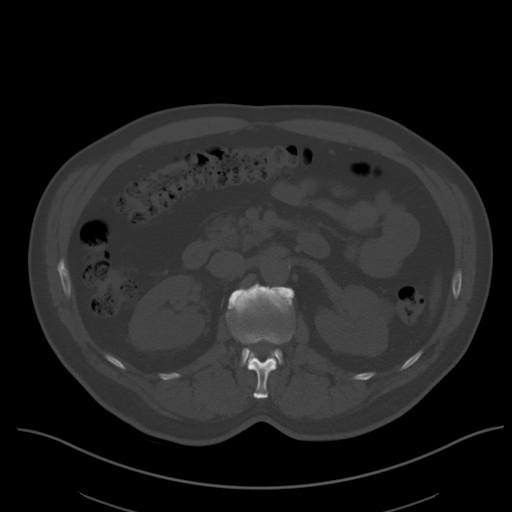
[im 84/119  soft-tissue]
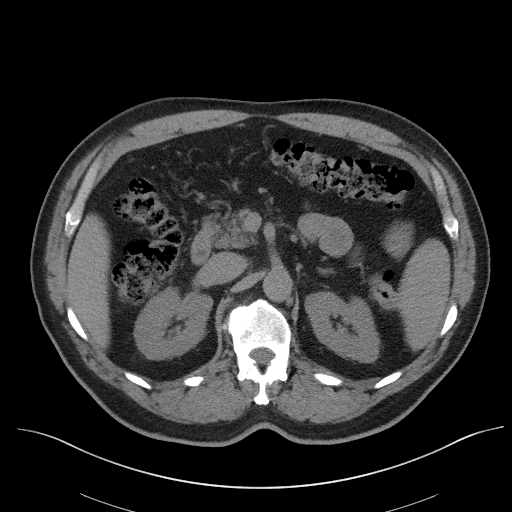
[im 94/119  soft-tissue]
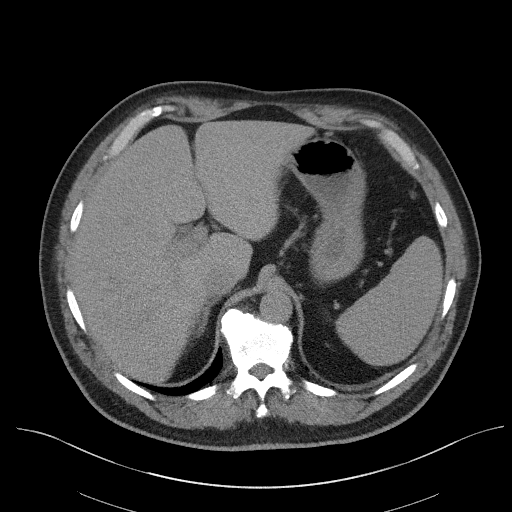
[im 104/119  soft-tissue]
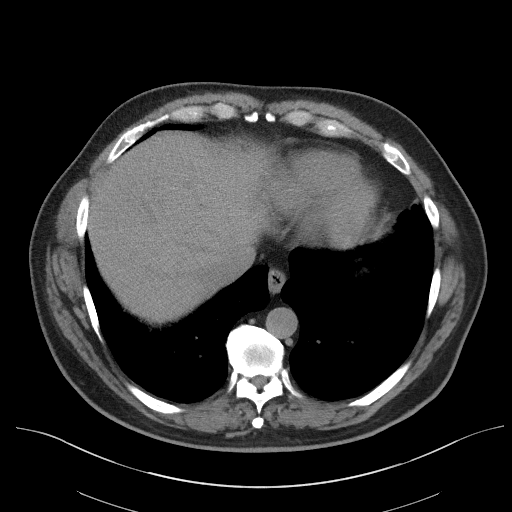
[im 114/119  soft-tissue]
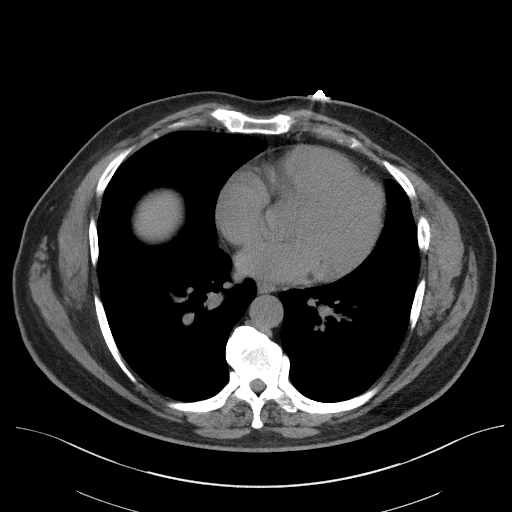

[Series 5: coronal · coronal · 0.83mm/px · 3 of 145 slices shown]
[im 49/145  soft-tissue]
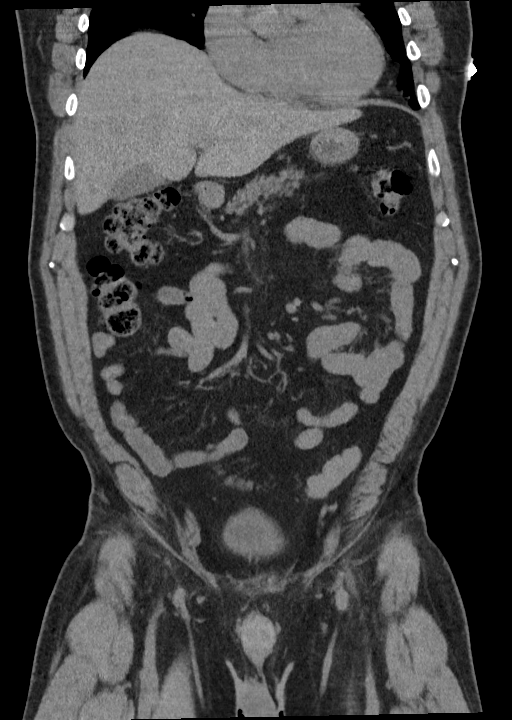
[im 65/145  soft-tissue]
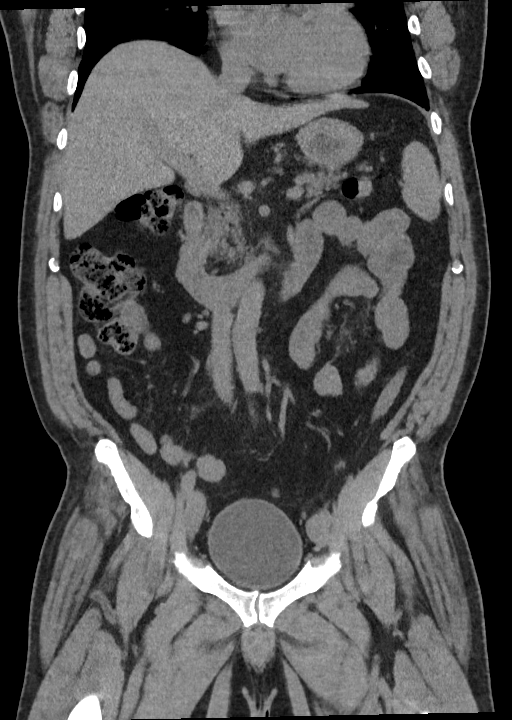
[im 81/145  soft-tissue]
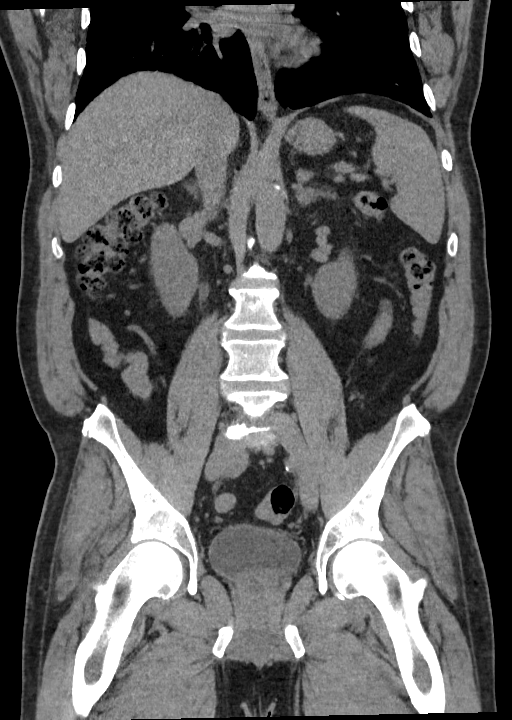

[16 of 46 positions shown; findings below may reference images not displayed]

FINDINGS: Lower chest: No acute abnormality.

Hepatobiliary: No focal liver abnormality is seen. No gallstones,
gallbladder wall thickening, or biliary dilatation.

Pancreas: Unremarkable. No pancreatic ductal dilatation or
surrounding inflammatory changes.

Spleen: Normal in size without focal abnormality.

Adrenals/Urinary Tract: Unremarkable adrenal glands. There are 2
left and 1 right punctate 2-3 mm renal calculi bilaterally. No
hydronephrosis. Simple 2.2 cm midpole left renal cyst. Kidneys
appear otherwise unremarkable. 3 mm calculus is located at the right
ureterovesical junction (series 2, image 92). Urinary bladder is
otherwise unremarkable.

Stomach/Bowel: Stomach is within normal limits. Appendix appears
normal. No evidence of bowel wall thickening, distention, or
inflammatory changes.

Vascular/Lymphatic: Scattered abdominal aortic atherosclerosis. No
aneurysm. No abdominopelvic lymphadenopathy.

Reproductive: Prostate gland is within normal limits. Partial
visualization of the scrotum demonstrates trace bilateral
hydroceles.

Other: No abdominal wall hernia or abnormality. No abdominopelvic
ascites.

Musculoskeletal: No acute or significant osseous findings.
IMPRESSION: 1. Nonobstructing 3 mm calculus at the right ureterovesical
junction.
2. Additional bilateral punctate nonobstructing renal calculi.
3. Partial visualization of the scrotum demonstrates trace bilateral
hydroceles.
4. Aortic atherosclerosis. ([id number]).

## 2022-01-18 ENCOUNTER — Encounter: Payer: Self-pay | Admitting: Family Medicine

## 2023-01-17 ENCOUNTER — Other Ambulatory Visit: Payer: Self-pay

## 2023-01-17 ENCOUNTER — Telehealth: Payer: Self-pay

## 2023-01-17 DIAGNOSIS — Z1211 Encounter for screening for malignant neoplasm of colon: Secondary | ICD-10-CM

## 2023-01-17 MED ORDER — NA SULFATE-K SULFATE-MG SULF 17.5-3.13-1.6 GM/177ML PO SOLN: 1.0000 | Freq: Once | ORAL | 0 refills | Status: AC

## 2023-01-17 NOTE — Telephone Encounter (Signed)
 Patient called in to schedule his colonoscopy.

## 2023-01-17 NOTE — Telephone Encounter (Signed)
Gastroenterology Pre-Procedure Review  Request Date: 02/21/21 Requesting Physician: Dr. Allegra Lai  PATIENT REVIEW QUESTIONS: The patient responded to the following health history questions as indicated:    1. Are you having any GI issues? no 2. Do you have a personal history of Polyps? no 3. Do you have a family history of Colon Cancer or Polyps? no 4. Diabetes Mellitus? no 5. Joint replacements in the past 12 months?no 6. Major health problems in the past 3 months?no 7. Any artificial heart valves, MVP, or defibrillator?no    MEDICATIONS & ALLERGIES:    Patient reports the following regarding taking any anticoagulation/antiplatelet therapy:   Plavix, Coumadin, Eliquis, Xarelto, Lovenox, Pradaxa, Brilinta, or Effient? no Aspirin? no  Patient confirms/reports the following medications:  No current outpatient medications on file.   No current facility-administered medications for this visit.    Patient confirms/reports the following allergies:  Allergies  Allergen Reactions   Codeine Rash and Itching    No orders of the defined types were placed in this encounter.   AUTHORIZATION INFORMATION Primary Insurance: 1D#: Group #:  Secondary Insurance: 1D#: Group #:  SCHEDULE INFORMATION: Date: 02/21/21 Time: Location: [facility]

## 2023-01-25 ENCOUNTER — Telehealth: Payer: Self-pay

## 2023-01-25 NOTE — Telephone Encounter (Signed)
Patient colonoscopy instructions got returned in the mail because they were not unable to deliver to the patient.

## 2023-02-22 ENCOUNTER — Ambulatory Visit: Payer: Commercial Managed Care - PPO | Admitting: Certified Registered Nurse Anesthetist

## 2023-02-22 ENCOUNTER — Encounter
Admission: RE | Disposition: A | Discharge status: Home / Self Care | Payer: Self-pay | Source: Home / Self Care | Attending: Gastroenterology

## 2023-02-22 ENCOUNTER — Encounter: Payer: Self-pay | Admitting: Gastroenterology

## 2023-02-22 ENCOUNTER — Ambulatory Visit
Admission: RE | Admit: 2023-02-22 | Discharge: 2023-02-22 | Disposition: A | Discharge status: Home / Self Care | Payer: Commercial Managed Care - PPO | Attending: Gastroenterology | Admitting: Gastroenterology

## 2023-02-22 DIAGNOSIS — Z1211 Encounter for screening for malignant neoplasm of colon: Secondary | ICD-10-CM | POA: Diagnosis present

## 2023-02-22 DIAGNOSIS — D124 Benign neoplasm of descending colon: Secondary | ICD-10-CM | POA: Diagnosis not present

## 2023-02-22 DIAGNOSIS — K635 Polyp of colon: Secondary | ICD-10-CM | POA: Diagnosis not present

## 2023-02-22 DIAGNOSIS — Z87891 Personal history of nicotine dependence: Secondary | ICD-10-CM | POA: Insufficient documentation

## 2023-02-22 HISTORY — PX: COLONOSCOPY WITH PROPOFOL: SHX5780

## 2023-02-22 HISTORY — PX: POLYPECTOMY: SHX5525

## 2023-02-22 SURGERY — COLONOSCOPY WITH PROPOFOL
Anesthesia: General

## 2023-02-22 MED ORDER — PROPOFOL 10 MG/ML IV BOLUS
INTRAVENOUS | Status: DC | PRN
Start: 2023-02-22 — End: 2023-02-22
  Administered 2023-02-22: 90 mg via INTRAVENOUS
  Administered 2023-02-22: 150 ug/kg/min via INTRAVENOUS
  Administered 2023-02-22: 10 mg via INTRAVENOUS

## 2023-02-22 MED ORDER — PROPOFOL 1000 MG/100ML IV EMUL
INTRAVENOUS | Status: AC
  Filled 2023-02-22: qty 100

## 2023-02-22 MED ORDER — SODIUM CHLORIDE 0.9 % IV SOLN: INTRAVENOUS | Status: DC

## 2023-02-22 MED ORDER — GLYCOPYRROLATE 0.2 MG/ML IJ SOLN
INTRAMUSCULAR | Status: DC | PRN
  Administered 2023-02-22: .2 mg via INTRAVENOUS

## 2023-02-22 NOTE — H&P (Signed)
  Arlyss Repress, MD [address]  [address]  [place], [place] [address]  Main: [phone]  Fax: [phone] Pager: [phone]  Primary Care Physician:  Dortha Kern, MD Primary Gastroenterologist:  Dr. Arlyss Repress  Pre-Procedure History & Physical: HPI:  Brandon Lauder. is a 67 y.o. male is here for an colonoscopy.   Past Medical History:  Diagnosis Date   Kidney stone     Past Surgical History:  Procedure Laterality Date   HAND SURGERY  2014    Prior to Admission medications   Not on File    Allergies as of 01/18/2023 - Review Complete 01/17/2023  Allergen Reaction Noted   Codeine Rash and Itching 12/18/2014    Family History  Problem Relation Age of Onset   Psoriasis Mother    Diabetes Father    COPD Father    Diabetes Sister    Psoriasis Brother     Social History   Socioeconomic History   Marital status: Divorced    Spouse name: Not on file   Number of children: Not on file   Years of education: Not on file   Highest education level: Not on file  Occupational History   Not on file  Tobacco Use   Smoking status: Former   Smokeless tobacco: Never   Tobacco comments:    quit 31 years ago  Substance and Sexual Activity   Alcohol use: Yes    Comment: beer 1-2 a day most days   Drug use: No   Sexual activity: Yes    Birth control/protection: None  Other Topics Concern   Not on file  Social History Narrative   Not on file   Social Determinants of Health   Financial Resource Strain: Not on file  Food Insecurity: Not on file  Transportation Needs: Not on file  Physical Activity: Not on file  Stress: Not on file  Social Connections: Not on file  Intimate Partner Violence: Not on file    Review of Systems: See HPI, otherwise negative ROS  Physical Exam: BP (!) 176/88   Pulse 64   Temp (!) 97.2 F (36.2 C) (Temporal)   Resp 16   Ht 6\' 1"  (1.854 m)   Wt 113.4 kg   SpO2 97%   BMI 32.98 kg/m  General:   Alert,   pleasant and cooperative in NAD Head:  Normocephalic and atraumatic. Neck:  Supple; no masses or thyromegaly. Lungs:  Clear throughout to auscultation.    Heart:  Regular rate and rhythm. Abdomen:  Soft, nontender and nondistended. Normal bowel sounds, without guarding, and without rebound.   Neurologic:  Alert and  oriented x4;  grossly normal neurologically.  Impression/Plan: Brandon Albert. is here for an colonoscopy to be performed for colon cancer screening  Risks, benefits, limitations, and alternatives regarding  colonoscopy have been reviewed with the patient.  Questions have been answered.  All parties agreeable.   Lannette Donath, MD  02/22/2023, 10:11 AM

## 2023-02-22 NOTE — Anesthesia Preprocedure Evaluation (Signed)
Anesthesia Evaluation  Patient identified by MRN, date of birth, ID band Patient awake    Reviewed: Allergy & Precautions, NPO status , Patient's Chart, lab work & pertinent test results  History of Anesthesia Complications Negative for: history of anesthetic complications  Airway Mallampati: II  TM Distance: >3 FB Neck ROM: Full    Dental no notable dental hx. (+) Teeth Intact   Pulmonary neg pulmonary ROS, neg sleep apnea, neg COPD, Patient abstained from smoking.Not current smoker, former smoker   Pulmonary exam normal breath sounds clear to auscultation       Cardiovascular Exercise Tolerance: Good METS(-) hypertension(-) CAD and (-) Past MI negative cardio ROS (-) dysrhythmias  Rhythm:Regular Rate:Normal - Systolic murmurs    Neuro/Psych negative neurological ROS  negative psych ROS   GI/Hepatic ,neg GERD  ,,(+)     (-) substance abuse    Endo/Other  neg diabetes    Renal/GU negative Renal ROS     Musculoskeletal   Abdominal  (+) + obese  Peds  Hematology   Anesthesia Other Findings Past Medical History: No date: Kidney stone  Reproductive/Obstetrics                             Anesthesia Physical Anesthesia Plan  ASA: 2  Anesthesia Plan: General   Post-op Pain Management: Minimal or no pain anticipated   Induction: Intravenous  PONV Risk Score and Plan: 2 and Propofol infusion, TIVA and Ondansetron  Airway Management Planned: Nasal Cannula  Additional Equipment: None  Intra-op Plan:   Post-operative Plan:   Informed Consent: I have reviewed the patients History and Physical, chart, labs and discussed the procedure including the risks, benefits and alternatives for the proposed anesthesia with the patient or authorized representative who has indicated his/her understanding and acceptance.     Dental advisory given  Plan Discussed with: CRNA and  Surgeon  Anesthesia Plan Comments: (Discussed risks of anesthesia with patient, including possibility of difficulty with spontaneous ventilation under anesthesia necessitating airway intervention, PONV, and rare risks such as cardiac or respiratory or neurological events, and allergic reactions. Discussed the role of CRNA in patient's perioperative care. Patient understands.)       Anesthesia Quick Evaluation

## 2023-02-22 NOTE — Op Note (Signed)
[facility] Gastroenterology Patient Name: Brandon Shields Procedure Date: 02/22/2023 10:46 AM MRN: [id number] Account #: [id number] Date of Birth: Jun 12, 1955 Admit Type: Outpatient Age: 67 Room: [facility] ENDO ROOM 3 Gender: Male Note Status: Finalized Instrument Name: Colonoscope [id number] Procedure:             Colonoscopy Indications:           Screening for colorectal malignant neoplasm Providers:             Toney Reil MD, MD Referring MD:          Dortha Kern (Referring MD) Medicines:             General Anesthesia Complications:         No immediate complications. Estimated blood loss: None. Procedure:             Pre-Anesthesia Assessment:                        - Prior to the procedure, a History and Physical was                         performed, and patient medications and allergies were                         reviewed. The patient is competent. The risks and                         benefits of the procedure and the sedation options and                         risks were discussed with the patient. All questions                         were answered and informed consent was obtained.                         Patient identification and proposed procedure were                         verified by the physician, the nurse, the                         anesthesiologist, the anesthetist and the technician                         in the pre-procedure area in the procedure room in the                         endoscopy suite. Mental Status Examination: alert and                         oriented. Airway Examination: normal oropharyngeal                         airway and neck mobility. Respiratory Examination:                         clear to auscultation. CV Examination: normal.  Prophylactic Antibiotics: The patient does not require                         prophylactic antibiotics. Prior Anticoagulants: The                          patient has taken no anticoagulant or antiplatelet                         agents. ASA Grade Assessment: II - A patient with mild                         systemic disease. After reviewing the risks and                         benefits, the patient was deemed in satisfactory                         condition to undergo the procedure. The anesthesia                         plan was to use general anesthesia. Immediately prior                         to administration of medications, the patient was                         re-assessed for adequacy to receive sedatives. The                         heart rate, respiratory rate, oxygen saturations,                         blood pressure, adequacy of pulmonary ventilation, and                         response to care were monitored throughout the                         procedure. The physical status of the patient was                         re-assessed after the procedure.                        After obtaining informed consent, the colonoscope was                         passed under direct vision. Throughout the procedure,                         the patient's blood pressure, pulse, and oxygen                         saturations were monitored continuously. The                         Colonoscope was introduced through the anus and  advanced to the the cecum, identified by appendiceal                         orifice and ileocecal valve. The colonoscopy was                         performed without difficulty. The patient tolerated                         the procedure well. The quality of the bowel                         preparation was evaluated using the BBPS [facility] Bowel                         Preparation Scale) with scores of: Right Colon = 3,                         Transverse Colon = 3 and Left Colon = 3 (entire mucosa                         seen well with no residual staining, small fragments                          of stool or opaque liquid). The total BBPS score                         equals 9. The ileocecal valve, appendiceal orifice,                         and rectum were photographed. Findings:      The perianal and digital rectal examinations were normal. Pertinent       negatives include normal sphincter tone and no palpable rectal lesions.      A 4 mm polyp was found in the descending colon. The polyp was sessile.       The polyp was removed with a cold snare. Resection and retrieval were       complete. Estimated blood loss: none.      The retroflexed view of the distal rectum and anal verge was normal and       showed no anal or rectal abnormalities. Impression:            - One 4 mm polyp in the descending colon, removed with                         a cold snare. Resected and retrieved.                        - The distal rectum and anal verge are normal on                         retroflexion view. Recommendation:        - Discharge patient to home (with escort).                        - Resume previous diet today.                        -  Continue present medications.                        - Await pathology results.                        - Repeat colonoscopy in 7-10 years for surveillance                         based on pathology results. Procedure Code(s):     --- Professional ---                        [phone], Colonoscopy, flexible; with removal of                         tumor(s), polyp(s), or other lesion(s) by snare                         technique Diagnosis Code(s):     --- Professional ---                        Z12.11, Encounter for screening for malignant neoplasm                         of colon                        D12.4, Benign neoplasm of descending colon CPT copyright 2022 American Medical Association. All rights reserved. The codes documented in this report are preliminary and upon coder review may  be revised to meet current compliance requirements. Dr.  Libby Maw Toney Reil MD, MD 02/22/2023 11:17:46 AM This report has been signed electronically. Number of Addenda: 0 Note Initiated On: 02/22/2023 10:46 AM Scope Withdrawal Time: 0 hours 9 minutes 53 seconds  Total Procedure Duration: 0 hours 13 minutes 3 seconds  Estimated Blood Loss:  Estimated blood loss: none.      [facility]

## 2023-02-22 NOTE — Anesthesia Postprocedure Evaluation (Signed)
Anesthesia Post Note  Patient: Brandon Shields.  Procedure(s) Performed: COLONOSCOPY WITH PROPOFOL POLYPECTOMY  Patient location during evaluation: Endoscopy Anesthesia Type: General Level of consciousness: awake and alert Pain management: pain level controlled Vital Signs Assessment: post-procedure vital signs reviewed and stable Respiratory status: spontaneous breathing, nonlabored ventilation, respiratory function stable and patient connected to nasal cannula oxygen Cardiovascular status: blood pressure returned to baseline and stable Postop Assessment: no apparent nausea or vomiting Anesthetic complications: no   No notable events documented.   Last Vitals:  Vitals:   02/22/23 1118 02/22/23 1128  BP: 116/76 115/79  Pulse: 77 61  Resp: 14 16  Temp: (!) 36.1 C   SpO2: 96% 97%    Last Pain:  Vitals:   02/22/23 1118  TempSrc: Temporal  PainSc: Asleep                 Corinda Gubler

## 2023-02-22 NOTE — Transfer of Care (Signed)
Immediate Anesthesia Transfer of Care Note  Patient: Brandon Shields.  Procedure(s) Performed: COLONOSCOPY WITH PROPOFOL POLYPECTOMY  Patient Location: Endoscopy Unit  Anesthesia Type:General  Level of Consciousness: awake and drowsy  Airway & Oxygen Therapy: Patient Spontanous Breathing  Post-op Assessment: Report given to RN and Post -op Vital signs reviewed and stable  Post vital signs: Reviewed and stable  Last Vitals:  Vitals Value Taken Time  BP 116/76 02/22/23 1119  Temp 36.1 C 02/22/23 1118  Pulse 73 02/22/23 1121  Resp 18 02/22/23 1121  SpO2 97 % 02/22/23 1121  Vitals shown include unfiled device data.  Last Pain:  Vitals:   02/22/23 1118  TempSrc: Temporal  PainSc: Asleep         Complications: No notable events documented.

## 2023-02-22 NOTE — Anesthesia Procedure Notes (Signed)
Date/Time: 02/22/2023 10:56 AM  Performed by: Ginger Carne, CRNAPre-anesthesia Checklist: Patient identified, Emergency Drugs available, Suction available, Patient being monitored and Timeout performed Patient Re-evaluated:Patient Re-evaluated prior to induction Oxygen Delivery Method: Nasal cannula Preoxygenation: Pre-oxygenation with 100% oxygen Induction Type: IV induction

## 2023-02-23 ENCOUNTER — Encounter: Payer: Self-pay | Admitting: Gastroenterology

## 2023-02-23 LAB — SURGICAL PATHOLOGY

## 2023-02-24 ENCOUNTER — Encounter: Payer: Self-pay | Admitting: Gastroenterology

## 2023-03-29 ENCOUNTER — Encounter: Payer: Self-pay | Admitting: Urology

## 2023-03-29 ENCOUNTER — Ambulatory Visit: Payer: Commercial Managed Care - PPO | Admitting: Urology

## 2023-03-29 VITALS — BP 167/82 | HR 76 | Ht 73.0 in | Wt 245.0 lb

## 2023-03-29 DIAGNOSIS — R972 Elevated prostate specific antigen [PSA]: Secondary | ICD-10-CM | POA: Diagnosis not present

## 2023-03-29 NOTE — Progress Notes (Signed)
   03/29/23 2:30 PM   Brandon Hermelinda Dellen Jr. 1955/05/27 [id number]  CC: Elevated PSA  HPI: Healthy 67 year old male referred for an elevated PSA of 5.3(19% free) which had increased from 4.6 last year.  He denies any family history of prostate cancer.  He denies any urinary symptoms or gross hematuria.  Prostate measured 56 g on prior CT from 2021.   PMH: Past Medical History:  Diagnosis Date   Kidney stone     Surgical History: Past Surgical History:  Procedure Laterality Date   COLONOSCOPY WITH PROPOFOL N/A 02/22/2023   Procedure: COLONOSCOPY WITH PROPOFOL;  Surgeon: Toney Reil, MD;  Location: [facility] ENDOSCOPY;  Service: Gastroenterology;  Laterality: N/A;   HAND SURGERY  2014   POLYPECTOMY  02/22/2023   Procedure: POLYPECTOMY;  Surgeon: Toney Reil, MD;  Location: [facility] ENDOSCOPY;  Service: Gastroenterology;;    Family History: Family History  Problem Relation Age of Onset   Psoriasis Mother    Diabetes Father    COPD Father    Diabetes Sister    Psoriasis Brother     Social History:  reports that he has quit smoking. He has never used smokeless tobacco. He reports current alcohol use. He reports that he does not use drugs.  Physical Exam: BP (!) 167/82 (BP Location: Left Arm, Patient Position: Sitting, Cuff Size: Normal)   Pulse 76   Ht 6\' 1"  (1.854 m)   Wt 245 lb (111.1 kg)   BMI 32.32 kg/m    Constitutional:  Alert and oriented, No acute distress. Cardiovascular: No clubbing, cyanosis, or edema. Respiratory: Normal respiratory effort, no increased work of breathing. GI: Abdomen is soft, nontender, nondistended, no abdominal masses   Laboratory Data: Reviewed  Pertinent Imaging: I have personally viewed and interpreted the CT scan from 2021 showing 56g prostate  Assessment & Plan:   67 year old male with mildly elevated PSA of 5.3 which increased slightly from 4.6 last year.  We reviewed the implications of an elevated PSA and the  uncertainty surrounding it. In general, a man's PSA increases with age and is produced by both normal and cancerous prostate tissue. The differential diagnosis for elevated PSA includes BPH, prostate cancer, infection, recent intercourse/ejaculation, recent urethroscopic manipulation (foley placement/cystoscopy) or trauma, and prostatitis. Management of an elevated PSA can include observation or prostate biopsy and we discussed this in detail. Our goal is to detect clinically significant prostate cancers, and manage with either active surveillance, surgery, or radiation for localized disease. Risks of prostate biopsy include bleeding, infection (including life threatening sepsis), pain, and lower urinary symptoms. Hematuria, hematospermia, and blood in the stool are all common after biopsy and can persist up to 4 weeks.  We also discussed the role of prostate MRI.  Using shared decision making, he opted for a prostate MRI, will call with results.  Will check PSA density  Legrand Rams, MD 03/29/2023  [facility] [address], [address] [place], [place] [address] [phone]

## 2023-03-29 NOTE — Patient Instructions (Signed)

## 2023-04-04 ENCOUNTER — Ambulatory Visit
Admission: RE | Admit: 2023-04-04 | Discharge: 2023-04-04 | Disposition: A | Discharge status: Home / Self Care | Payer: Commercial Managed Care - PPO | Source: Ambulatory Visit | Attending: Urology | Admitting: Urology

## 2023-04-04 DIAGNOSIS — R972 Elevated prostate specific antigen [PSA]: Secondary | ICD-10-CM | POA: Diagnosis present

## 2023-04-04 MED ORDER — GADOBUTROL 1 MMOL/ML IV SOLN
10.0000 mL | Freq: Once | INTRAVENOUS | Status: AC | PRN
  Administered 2023-04-04: 10 mL via INTRAVENOUS

## 2023-04-05 ENCOUNTER — Telehealth: Payer: Self-pay

## 2023-04-05 NOTE — Telephone Encounter (Signed)
Called pt informed him of the information below. Pt voiced understanding, denies taking blood thinners. Appt scheduled. Pre biopsy instructions reviewed and sent via mychart.

## 2023-04-05 NOTE — Telephone Encounter (Signed)
-----   Message from Sondra Come sent at 04/05/2023  8:15 AM EST ----- MRI shows 2 small abnormal areas that do warrant biopsy.  Please schedule MRI fusion biopsy and review instructions, thank you  Legrand Rams, MD 04/05/2023

## 2023-04-27 ENCOUNTER — Telehealth: Payer: Self-pay | Admitting: Urology

## 2023-04-27 NOTE — Telephone Encounter (Signed)
Pt has a fusion biopsy scheduled for this Friday.  He would like to know if Dr Apolinar Junes will send anything in for him, like a Valium, he's super nervous.  He uses [employer].

## 2023-04-28 MED ORDER — DIAZEPAM 10 MG PO TABS: 10.0000 mg | ORAL_TABLET | Freq: Once | ORAL | 0 refills | Status: AC

## 2023-04-28 NOTE — Telephone Encounter (Signed)
Done

## 2023-04-28 NOTE — Telephone Encounter (Signed)
Patient advised.

## 2023-04-29 ENCOUNTER — Ambulatory Visit: Payer: Commercial Managed Care - PPO | Admitting: Urology

## 2023-04-29 VITALS — BP 146/89 | HR 68

## 2023-04-29 DIAGNOSIS — C61 Malignant neoplasm of prostate: Secondary | ICD-10-CM | POA: Diagnosis not present

## 2023-04-29 DIAGNOSIS — Z2989 Encounter for other specified prophylactic measures: Secondary | ICD-10-CM | POA: Diagnosis not present

## 2023-04-29 DIAGNOSIS — R972 Elevated prostate specific antigen [PSA]: Secondary | ICD-10-CM | POA: Diagnosis not present

## 2023-04-29 MED ORDER — LEVOFLOXACIN 500 MG PO TABS
500.0000 mg | ORAL_TABLET | Freq: Once | ORAL | Status: AC
Start: 2023-04-29 — End: 2023-04-29
  Administered 2023-04-29: 500 mg via ORAL

## 2023-04-29 MED ORDER — GENTAMICIN SULFATE 40 MG/ML IJ SOLN
80.0000 mg | Freq: Once | INTRAMUSCULAR | Status: AC
Start: 2023-04-29 — End: 2023-04-29
  Administered 2023-04-29: 80 mg via INTRAMUSCULAR

## 2023-04-29 NOTE — Progress Notes (Signed)
   04/29/23  Indication: 67 year old male with elevated PSA to 5.3.  Prostate MRI shows 2 PI-RADS 4 lesions.  MRI Fusion Prostate Biopsy Procedure   Informed consent was obtained, and we discussed the risks of bleeding and infection/sepsis. A time out was performed to ensure correct patient identity.  Pre-Procedure: - Last PSA Level: 5.3 - Gentamicin and levaquin given for antibiotic prophylaxis -Prostate measured 60.1 g on MRI, PSA density 0.09  Procedure: - Prostate block performed using 10 cc 1% lidocaine  - MRI fusion biopsy was performed, and 3 biopsies were taken from the ROI #1 PIRADS4 lesion located at  the right posterolateral peripheral zone at the apex as well as ROI #2 Small PI-RADS category 4 lesion of the left posterolateral peripheral zone in the mid gland - Standard biopsies taken from sextant areas, 12 under ultrasound guidance. - Total of 18 cores taken -Median lobe noted  Post-Procedure: - Patient tolerated the procedure well - He was counseled to seek immediate medical attention if experiences significant bleeding, fevers, or severe pain - Return in one week to discuss biopsy results  Assessment/ Plan: Will follow up in 1-2 weeks to discuss pathology with Dr. Zadie Rhine, MD

## 2023-04-29 NOTE — Patient Instructions (Addendum)

## 2023-05-10 ENCOUNTER — Ambulatory Visit: Payer: Commercial Managed Care - PPO | Admitting: Urology

## 2023-05-10 VITALS — BP 183/97 | HR 74 | Ht 73.0 in | Wt 245.0 lb

## 2023-05-10 DIAGNOSIS — C61 Malignant neoplasm of prostate: Secondary | ICD-10-CM

## 2023-05-10 DIAGNOSIS — N529 Male erectile dysfunction, unspecified: Secondary | ICD-10-CM | POA: Diagnosis not present

## 2023-05-10 MED ORDER — TADALAFIL 5 MG PO TABS
5.0000 mg | ORAL_TABLET | Freq: Every day | ORAL | 6 refills | Status: DC | PRN
Start: 2023-05-10 — End: 2024-01-24

## 2023-05-10 NOTE — Progress Notes (Signed)
   05/10/2023 8:37 AM   Brandon Shields. 04-20-1956 [id number]  Reason for visit: Review prostate biopsy results, new diagnosis of prostate cancer  HPI: Healthy 67 year old male who presented with an elevated PSA of 5.3, and opted for a prostate MRI.  This showed a 60 g prostate with 2 PI-RADS 4 lesions in the right peripheral zone and left peripheral zone, no evidence of metastatic disease or lymphadenopathy.  He underwent MRI fusion biopsy with Dr. Penne on 04/29/2023, median lobe was noted, and biopsy showed Gleason score 4+4=8 prostate adenocarcinoma in the ROI, as well as additional unfavorable intermediate risk disease on the right side of the prostate.  Max core involvement was 20%.  He denies any prior abdominal surgeries.  No significant urinary symptoms at baseline.  Has mild ED, has never tried medications but interested in a trial of Cialis .  We had a lengthy conversation today about the patient's new diagnosis of prostate cancer.  We reviewed the risk classifications per the AUA guidelines including very low risk, low risk, intermediate risk, and high risk disease, and the need for additional staging imaging with CT and bone scan in patients with unfavorable intermediate risk and high risk disease.  I explained that his life expectancy, clinical stage, Gleason score, PSA, and other co-morbidities influence treatment strategies.  We discussed the roles of active surveillance, radiation therapy, surgical therapy with robotic prostatectomy, and hormone therapy with androgen deprivation.  We discussed that patients urinary symptoms also impact treatment strategy, as patients with severe lower urinary tract symptoms may have significant worsening or even develop urinary retention after undergoing radiation.  In regards to surgery, we discussed robotic prostatectomy +/- lymphadenectomy at length.  The procedure takes 3 to 4 hours, and patient's typically discharge home on post-op day #1.   A Foley catheter is left in place for 7 to 10 days to allow for healing of the vesicourethral anastomosis.  There is a small risk of bleeding, infection, damage to surrounding structures or bowel, hernia, DVT/PE, or serious cardiac or pulmonary complications.  We discussed at length post-op side effects including erectile dysfunction, and the importance of pre-operative erectile function on long-term outcomes.  Even with a nerve sparing approach, there is an approximately 25% rate of permanent erectile dysfunction.  We also discussed postop urinary incontinence at length.  We expect patients to have stress incontinence post-operatively that will improve over period of weeks to months.  Less than 10% of men will require a pad at 1 year after surgery.  Patients will need to avoid heavy lifting and strenuous activity for 3 to 4 weeks, but most men return to their baseline activity status by 6 weeks.  In summary, Brandon Dahir. is a very healthy 67 y.o. man with newly diagnosed high risk prostate cancer. He is unsure how he would like to proceed.  I think either option would be very reasonable.  He will meet with radiation oncology and finalize treatment strategy.  Defer to radiation oncology if they would like a bone scan or PSMA PET scan for additional staging, but MRI with no evidence of metastatic disease.  Trial of Cialis  5 to 10 mg on demand sent to [employer].    Brandon JAYSON Burnet, MD  [facility] [address] [place], [place] [address] [phone]

## 2023-05-10 NOTE — Patient Instructions (Signed)
 Prostate Cancer  The prostate is a small gland that produces fluid that makes up semen (seminal fluid). It is located below the bladder in men, in front of the rectum. Prostate cancer is the abnormal growth of cells in the prostate gland. What are the causes? The exact cause of this condition is not known. What increases the risk? You are more likely to develop this condition if: You are 66 years of age or older. You have a family history of prostate cancer. You have a family history of breast and ovarian cancer. You have genes that are passed from parent to child (inherited), such as BRCA1 and BRCA2. You have Lynch syndrome. African American men and men of African descent are diagnosed with prostate cancer at higher rates than other men. The reasons for this are not well understood and are likely due to a combination of genetic and environmental factors. What are the signs or symptoms? Symptoms of this condition include: Problems with urination. This may include: A weak or interrupted flow of urine. Trouble starting or stopping urination. Trouble emptying the bladder all the way. The need to urinate more often, especially at night. Blood in urine or semen. Persistent pain or discomfort in the lower back, lower abdomen, or hips. Trouble getting an erection. Weakness or numbness in the legs or feet. How is this diagnosed? This condition can be diagnosed with: A digital rectal exam. For this exam, a health care provider inserts a gloved finger into the rectum to feel the prostate gland. A blood test called a prostate-specific antigen (PSA) test. A procedure in which a sample of tissue is taken from the prostate and checked under a microscope (prostate biopsy). An imaging test called transrectal ultrasonography. Once the condition is diagnosed, tests will be done to determine how far the cancer has spread. This is called staging the cancer. Staging may involve imaging tests, such as a bone  scan, CT scan, PET scan, or MRI. Stages of prostate cancer The stages of prostate cancer are as follows: Stage 1 (I). At this stage, the cancer is found in the prostate only. The cancer is not visible on imaging tests, and it is usually found by accident, such as during prostate surgery. Stage 2 (II). At this stage, the cancer is more advanced than it is in stage 1, but the cancer has not spread outside the prostate. Stage 3 (III). At this stage, the cancer has spread beyond the outer layer of the prostate to nearby tissues. The cancer may be found in the seminal vesicles, which are near the bladder and the prostate. Stage 4 (IV). At this stage, the cancer has spread to other parts of the body, such as the lymph nodes, bones, bladder, rectum, liver, or lungs. Prostate cancer grading Prostate cancer is also graded according to how the cancer cells look under a microscope. This is called the Gleason score and the total score can range from 6-10, indicating how likely it is that the cancer will spread (metastasize) to other parts of the body. The higher the score, the greater the likelihood that the cancer will spread. Gleason 6 or lower: This indicates that the cancer cells look similar to normal prostate cells (well differentiated). Gleason 7: This indicates that the cancer cells look somewhat similar to normal prostate cells (moderately differentiated). Gleason 8, 9, or 10: This indicates that the cancer cells look very different than normal prostate cells (poorly differentiated). How is this treated? Treatment for this condition depends on several  factors, including the stage of the cancer, your age, personal preferences, and your overall health. Talk with your health care provider about treatment options that are recommended for you. Common treatments include: Observation for early stage prostate cancer (active surveillance). This involves having exams, blood tests, and in some cases, more biopsies.  For some men, this is the only treatment needed. Surgery.   A robotic radical prostatectomy. This is laparoscopic surgery to remove the prostate and lymph nodes with the help of robotic arms that are controlled by the surgeon.  Radiation treatment. Types of radiation treatment include: External beam radiation. This type aims beams of radiation from outside the body at the prostate to destroy cancerous cells. Brachytherapy. This type uses radioactive needles, seeds, wires, or tubes that are implanted into the prostate gland. Like external beam radiation, brachytherapy destroys cancerous cells. An advantage is that this type of radiation limits the damage to surrounding tissue and has fewer side effects.  Hormone treatment. This treatment involves taking medicines that act on testosterone, one of the male hormones, by: Stopping your body from producing testosterone. Blocking testosterone from reaching cancer cells. Follow these instructions at home: Lifestyle Do not use any products that contain nicotine or tobacco. These products include cigarettes, chewing tobacco, and vaping devices, such as e-cigarettes. If you need help quitting, ask your health care provider. Eat a healthy diet. To do this: Eat foods that are high in fiber. These include beans, whole grains, and fresh fruits and vegetables. Limit foods that are high in fat and sugar. These include fried or sweet foods. Treatment for prostate cancer may affect sexual function. If you have a partner, continue to have intimate moments. This may include touching, holding, hugging, and caressing your partner. Get plenty of sleep. Consider joining a support group for men who have prostate cancer. Meeting with a support group may help you learn to manage the stress of having cancer. General instructions Take over-the-counter and prescription medicines only as told by your health care provider. If you have to go to the hospital, notify your cancer  specialist (oncologist). Keep all follow-up visits. This is important. Where to find more information American Cancer Society: www.cancer.org American Society of Clinical Oncology: www.cancer.net [employer]: www.cancer.gov Contact a health care provider if: You have new or increasing trouble urinating. You have new or increasing blood in your urine. You have new or increasing pain in your hips, back, or chest. Get help right away if: You have weakness or numbness in your legs. You cannot control urination or your bowel movements (incontinence). You have chills or a fever. Summary The prostate is a small gland that is involved in the production of semen. It is located below a man's bladder, in front of the rectum. Prostate cancer is the abnormal growth of cells in the prostate gland. Treatment for this condition depends on the stage of the cancer, your age, personal preferences, and your overall health. Talk with your health care provider about treatment options that are recommended for you. Consider joining a support group for men who have prostate cancer. Meeting with a support group may help you learn to manage the stress of having cancer. This information is not intended to replace advice given to you by your health care provider. Make sure you discuss any questions you have with your health care provider. Document Revised: 07/23/2020 Document Reviewed: 07/23/2020 Elsevier Patient Education  2024 [employer]  Robot-Assisted Laparoscopic Radical Prostatectomy  Robot-assisted laparoscopic radical prostatectomy is surgery  done to remove the entire prostate and nearby tissue. This includes the seminal vesicles, which are near the bladder and the prostate. This procedure is done to treat prostate cancer that has not spread (metastasized) to other parts of the body. The goal of the surgery is to remove all cancer cells to help keep the cancer from metastasizing. During this  procedure, the surgeon makes several incisions in the abdomen instead of one large incision. A long, thin, lighted tube with a tiny camera on the end (laparoscope) is put into one of the incisions. This allows the surgeon to see inside the abdomen. Other surgical tools are put in through the other incisions and used to take out the prostate and nearby tissues. The surgeon uses robotic arms to control these tools while sitting at a computer near the operating table. Lymph nodes in the pelvis may also be removed. Lymph nodes are part of the body's disease-fighting system (immune system). When prostate cancer spreads, it tends to go to the lymph nodes in the pelvis first. If the pelvic lymph nodes are removed, they will be checked for cancer cells. Tell a health care provider about: Any allergies you have. All medicines you are taking, including vitamins, herbs, eye drops, creams, and over-the-counter medicines. Any problems you or family members have had with anesthetic medicines. Any bleeding problems you have. Any surgeries you have had. Any medical conditions you have. Any prostate infections you have had. What are the risks? Generally, this is a safe procedure. Still, problems may occur, including: Infection. Bleeding. Allergic reactions to medicines. Damage to nearby structures or organs, such as the rectum, ureters, urethra, bladder, or small intestine. Blockage (obstruction) of the large or small intestines. Problems that affect urination or sexual function. These may include: Narrowing or scarring of the urethra (stricture), which may block the flow of urine. Inability to control when you urinate (incontinence). Inability to get or keep an erection (erectile dysfunction). Dry ejaculation. This is when no semen comes out during orgasm. The formation of a sac (cyst) in the pelvis that is filled with fluid from the lymph glands (lymphocele). Blood clots in the legs. What happens before the  procedure? Staying hydrated Follow instructions from your health care provider about hydration, which may include: Up to 2 hours before the procedure - you may continue to drink clear liquids, such as water, clear fruit juice, black coffee, and plain tea.  Eating and drinking restrictions Follow instructions from your health care provider about eating and drinking, which may include: 8 hours before the procedure - stop eating heavy meals or foods, such as meat, fried foods, or fatty foods. 6 hours before the procedure - stop eating light meals or foods, such as toast or cereal. 6 hours before the procedure - stop drinking milk or drinks that contain milk. 2 hours before the procedure - stop drinking clear liquids. Medicines Ask your health care provider about: Changing or stopping your regular medicines. This is especially important if you are taking diabetes medicines or blood thinners. Taking medicines such as aspirin and ibuprofen. These medicines can thin your blood. Do not take these medicines unless your health care provider tells you to take them. Taking over-the-counter medicines, vitamins, herbs, and supplements. Follow your health care provider's instructions about cleaning out your bowels. Surgery safety Ask your health care provider: How your surgery site will be marked. What steps will be taken to help prevent infection. These steps may include: Removing hair at the surgery site.  Washing skin with a germ-killing soap. Taking antibiotic medicine. General instructions Do not use any products that contain nicotine or tobacco for at least 4 weeks before the procedure. These products include cigarettes, chewing tobacco, and vaping devices, such as e-cigarettes. If you need help quitting, ask your health care provider. Plan to have a responsible adult take you home from the hospital or clinic. Plan to have a responsible adult care for you for the time you are told after you leave  the hospital or clinic. You may have an exam or testing. This may include blood or urine samples, or imaging tests such as a CT scan or an MRI. What happens during the procedure? An IV will be put into a vein in your hand or arm. You may be given: A medicine to help you relax (sedative). A medicine to make you fall asleep (general anesthetic). A thin, flexible tube (Foley catheter) will be put into your penis through your urethra and into your bladder to drain your urine. Small incisions will be made in your abdomen and near your belly button. The laparoscope and other surgical instruments will be put through the incisions. The surgical tools will be used to cut and remove your prostate, seminal vesicles, and maybe your pelvic lymph nodes. Your surgeon will use a computer and robotic arms to control the surgical instruments. Your urethra will be cut and separated from your bladder to take out the prostate. Your urethra will then be reconnected to your bladder neck. This is the group of muscles that help push urine through your urethra. A small tube (drain) may be put in one or more of your incisions to help drain extra fluid from your surgical site after surgery. The laparoscope and other surgical instruments will be removed. Your incisions will be closed with stitches (sutures), skin glue, or adhesive strips. Medicine may be applied and bandages (dressings) will be placed over your incisions. The procedure may vary among health care providers and hospitals. What happens after the procedure? Your blood pressure, heart rate, breathing rate, and blood oxygen level will be monitored until you leave the hospital or clinic. You may get fluids and medicines through your IV. You may be given antibiotics and medicines to help relieve pain or nausea. You will be encouraged to walk as soon as possible. You will also use a device or do breathing exercises to keep your lungs clear. The catheter will stay in  to drain urine from your bladder. You will be taught how to care for it at home. The drain may stay in to drain fluid from the surgical site. If so, you will be taught how to care for it at home. You may need to wear compression stockings until you are able to get up and walk around. These stockings help prevent blood clots and reduce swelling in your legs. If you were given a sedative during the procedure, it can affect you for several hours. Do not drive or operate machinery until your health care provider says that it is safe. Summary Robot-assisted laparoscopic radical prostatectomy is a surgical procedure to remove the entire prostate and the seminal vesicles. Follow instructions from your health care provider about eating and drinking before your surgery. After your procedure, you may be given fluids and medicines through an IV. You may get antibiotics and medicines to help relieve pain or nausea. After your surgery, you will continue to have a small, thin tube (Foley catheter) draining your urine. You will be  taught how to care for it at home. This information is not intended to replace advice given to you by your health care provider. Make sure you discuss any questions you have with your health care provider. Document Revised: 07/23/2020 Document Reviewed: 07/23/2020 Elsevier Patient Education  2024 [employer].

## 2023-05-16 ENCOUNTER — Encounter: Payer: Self-pay | Admitting: Radiation Oncology

## 2023-05-16 ENCOUNTER — Ambulatory Visit
Admission: RE | Admit: 2023-05-16 | Discharge: 2023-05-16 | Disposition: A | Discharge status: Home / Self Care | Payer: Commercial Managed Care - PPO | Source: Ambulatory Visit | Attending: Radiation Oncology | Admitting: Radiation Oncology

## 2023-05-16 VITALS — BP 169/101 | HR 73 | Temp 98.0°F | Resp 16 | Ht 73.0 in | Wt 258.0 lb

## 2023-05-16 DIAGNOSIS — N529 Male erectile dysfunction, unspecified: Secondary | ICD-10-CM | POA: Insufficient documentation

## 2023-05-16 DIAGNOSIS — C61 Malignant neoplasm of prostate: Secondary | ICD-10-CM | POA: Diagnosis present

## 2023-05-16 DIAGNOSIS — Z87891 Personal history of nicotine dependence: Secondary | ICD-10-CM | POA: Diagnosis not present

## 2023-05-16 DIAGNOSIS — Z87442 Personal history of urinary calculi: Secondary | ICD-10-CM | POA: Insufficient documentation

## 2023-05-16 NOTE — Consult Note (Signed)
 NEW PATIENT EVALUATION  Name: Brandon Shields.  MRN: [id number]  Date:   05/16/2023     DOB: 1956/04/02   This 68 y.o. 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  REFERRING PHYSICIAN: Derick Leita POUR, MD  CHIEF COMPLAINT:  Chief Complaint  Patient presents with   Prostate Cancer    DIAGNOSIS: The encounter diagnosis was Malignant neoplasm of prostate (HCC).   PREVIOUS INVESTIGATIONS:  MRI scan reviewed Clinical notes reviewed Pathology reports reviewed  HPI: Patient is a 68 year old male fairly asymptomatic except for some ED who presents with a PSA of 5.3.  MRI scan was performed showing 2 PI-RADS category 4 lesions in the peripheral zone.  He had a 60 cc prostate.  There is no evidence of pelvic adenopathy bone metastasis or trans capsular spread.  Patient underwent targeted biopsy showing 4 out of 13 cores positive for mixture of Gleason 8 (4+4) and Gleason 7 (4+3) adenocarcinoma.  Again patient is asymptomatic specifically denies any increased lower urinary tract symptoms any bowel problems bone pain.  He does have some erectile dysfunction is being treated with Cialis .  He has met with urology who has discussed robotic assisted prostatectomy and now seen for radiation oncology opinion.  PLANNED TREATMENT REGIMEN: Prostatectomy is #1 option  PAST MEDICAL HISTORY:  has a past medical history of Kidney stone.    PAST SURGICAL HISTORY:  Past Surgical History:  Procedure Laterality Date   COLONOSCOPY WITH PROPOFOL  N/A 02/22/2023   Procedure: COLONOSCOPY WITH PROPOFOL ;  Surgeon: Unk Corinn Skiff, MD;  Location: [facility] ENDOSCOPY;  Service: Gastroenterology;  Laterality: N/A;   HAND SURGERY  2014   POLYPECTOMY  02/22/2023   Procedure: POLYPECTOMY;  Surgeon: Unk Corinn Skiff, MD;  Location: [facility] ENDOSCOPY;  Service: Gastroenterology;;    FAMILY HISTORY: family  history includes COPD in his father; Diabetes in his father and sister; Psoriasis in his brother and mother.  SOCIAL HISTORY:  reports that he has quit smoking. He has never used smokeless tobacco. He reports current alcohol use. He reports that he does not use drugs.  ALLERGIES: Codeine  MEDICATIONS:  Current Outpatient Medications  Medication Sig Dispense Refill   tadalafil  (CIALIS ) 5 MG tablet Take 1-2 tablets (5-10 mg total) by mouth daily as needed for erectile dysfunction (take 45 minutes prior to sexual activity). 30 tablet 6   No current facility-administered medications for this encounter.    ECOG PERFORMANCE STATUS:  0 - Asymptomatic  REVIEW OF SYSTEMS: Patient denies any weight loss, fatigue, weakness, fever, chills or night sweats. Patient denies any loss of vision, blurred vision. Patient denies any ringing  of the ears or hearing loss. No irregular heartbeat. Patient denies heart murmur or history of fainting. Patient denies any chest pain or pain radiating to her upper extremities. Patient denies any shortness of breath, difficulty breathing at night, cough or hemoptysis. Patient denies any swelling in the lower legs. Patient denies any nausea vomiting, vomiting of blood, or coffee ground material in the vomitus. Patient denies any stomach pain. Patient states has had normal bowel movements no significant constipation or diarrhea. Patient denies any dysuria, hematuria or significant nocturia. Patient denies any problems walking, swelling in the joints or loss of balance. Patient denies any skin changes, loss of hair or loss of weight. Patient denies any excessive worrying or anxiety or significant depression. Patient denies any problems with  insomnia. Patient denies excessive thirst, polyuria, polydipsia. Patient denies any swollen glands, patient denies easy bruising or easy bleeding. Patient denies any recent infections, allergies or URI. Patient s visual fields have not changed  significantly in recent time.   PHYSICAL EXAM: BP (!) 169/101 Comment: Patient denies H/A will recheck at home  Pulse 73   Temp 98 F (36.7 C) (Tympanic)   Resp 16   Ht 6' 1 (1.854 m)   Wt 258 lb (117 kg)   BMI 34.04 kg/m  Well-developed well-nourished patient in NAD. HEENT reveals PERLA, EOMI, discs not visualized.  Oral cavity is clear. No oral mucosal lesions are identified. Neck is clear without evidence of cervical or supraclavicular adenopathy. Lungs are clear to A&P. Cardiac examination is essentially unremarkable with regular rate and rhythm without murmur rub or thrill. Abdomen is benign with no organomegaly or masses noted. Motor sensory and DTR levels are equal and symmetric in the upper and lower extremities. Cranial nerves II through XII are grossly intact. Proprioception is intact. No peripheral adenopathy or edema is identified. No motor or sensory levels are noted. Crude visual fields are within normal range.  LABORATORY DATA: Pathology reports reviewed    RADIOLOGY RESULTS: MRI scan reviewed compatible with above-stated findings   IMPRESSION: Stage IIc Gleason 8 (4+4) adenocarcinoma the prostate presenting with a PSA of 5.3 in 68 year old male  PLAN: At this time I have gone over recommendations regarding radiation therapy.  Based on the size of his prostate at 60 cc he is not a candidate for I-125 interstitial implant.  I gone over risks and benefits of external beam image guided IMRT radiation therapy.  We also had a discussion of the probability of salvage should he have surgery we can still use salvage radiation therapy and 8 out of 10 men to achieve disease eradication.  Also explained the converse is not true there is really no salvage strategy should he undergo radiation therapy as a primary mode of treatment.  Patient appears to be young and healthy and I have suggested #1 choice would be to move forward with robotic assisted prostatectomy.  Patient comprehends my  recommendations well he will be contacting urology in the near future with his decision.  I would like to take this opportunity to thank you for allowing me to participate in the care of your patient.SABRA Marcey Penton, MD

## 2023-05-18 ENCOUNTER — Telehealth: Payer: Self-pay

## 2023-05-18 ENCOUNTER — Other Ambulatory Visit: Payer: Self-pay | Admitting: Urology

## 2023-05-18 ENCOUNTER — Other Ambulatory Visit: Payer: Self-pay

## 2023-05-18 DIAGNOSIS — C61 Malignant neoplasm of prostate: Secondary | ICD-10-CM

## 2023-05-18 NOTE — Progress Notes (Signed)
 Surgical Physician Order Form [facility]  Dr. Redell Burnet, MD  * Scheduling expectation : Next Available  *Length of Case: 4 hours  *Clearance needed: no  *Anticoagulation Instructions: Hold all anticoagulants  *Aspirin Instructions: Hold Aspirin  *Post-op visit Date/Instructions:  1 week cath removal  *Diagnosis: Prostate Cancer  *Procedure:  Robotic laparoscopic Prostatectomy (44133) and lymph node dissection   Additional orders: N/A  -Admit type: OUTpatient  -Anesthesia: General  -VTE Prophylaxis Standing Order SCD's  , 5000 units subcutaneous heparin     Other:   -Standing Lab Orders Per Anesthesia    Lab other: Type&Screen, CBC, BMP  -Standing Test orders EKG/Chest x-ray per Anesthesia       Test other:   - Medications:  Ancef  2gm IV  -Other orders:  N/A

## 2023-05-18 NOTE — Progress Notes (Signed)
   [facility] Surgical Posting Form  Surgery Date: Date: 06/06/2023  Surgeon: Dr. Redell Burnet, MD  Inpt ( No  )   Outpt (No)   Obs ( Yes  )   Diagnosis: C61 Prostate Cancer  -CPT: [phone], [phone]  Surgery: Robotic Laparoscopic Radical Prostatectomy with Bilateral Pelvic Lymph Node Dissection   Stop Anticoagulations: Yes and also hold ASA  Cardiac/Medical/Pulmonary Clearance needed: no  *Orders entered into EPIC  Date: 05/18/23   *Case booked in EPIC  Date: 05/18/23  *Notified pt of Surgery: Date: 05/18/23  PRE-OP UA & CX: yes, and will also obtain Type and Screen , CBC , INR, BMP  *Placed into Prior Authorization Work [place] Date: 05/18/23  Assistant/laser/rep:Yes, Dr. Penne to also assist.

## 2023-05-18 NOTE — Telephone Encounter (Signed)
 Per Dr. Francisca, Patient is to be scheduled for Robotic Laparoscopic Radical Prostatectomy with Bilateral Pelvic Lymph Node Dissection   [name] was contacted and possible surgical dates were discussed, Monday January 27th, 2025 was agreed upon for surgery.   Patient was directed to call [phone] between 1-3pm the day before surgery to find out surgical arrival time.  Instructions were given not to eat or drink from midnight on the night before surgery and have a driver for the day of surgery. On the surgery day patient was instructed to enter through the [facility] entrance of [facility] report the Same Day Surgery desk.   Pre-Admit Testing will be in contact via phone to set up an interview with the anesthesia team to review your history and medications prior to surgery.   Reminder of this information was sent via MyChart to the patient.

## 2023-05-19 ENCOUNTER — Institutional Professional Consult (permissible substitution): Payer: Commercial Managed Care - PPO | Admitting: Radiation Oncology

## 2023-05-30 ENCOUNTER — Other Ambulatory Visit: Admission: RE | Admit: 2023-05-30 | Payer: Commercial Managed Care - PPO | Source: Home / Self Care

## 2023-05-30 ENCOUNTER — Encounter
Admission: RE | Admit: 2023-05-30 | Discharge: 2023-05-30 | Disposition: A | Discharge status: Home / Self Care | Payer: Commercial Managed Care - PPO | Source: Ambulatory Visit | Attending: Urology | Admitting: Urology

## 2023-05-30 ENCOUNTER — Other Ambulatory Visit: Payer: Self-pay

## 2023-05-30 ENCOUNTER — Encounter
Admission: RE | Admit: 2023-05-30 | Discharge: 2023-05-30 | Disposition: A | Discharge status: Home / Self Care | Payer: Commercial Managed Care - PPO | Source: Ambulatory Visit | Attending: Urology

## 2023-05-30 DIAGNOSIS — Z01812 Encounter for preprocedural laboratory examination: Secondary | ICD-10-CM | POA: Diagnosis present

## 2023-05-30 DIAGNOSIS — C61 Malignant neoplasm of prostate: Secondary | ICD-10-CM | POA: Insufficient documentation

## 2023-05-30 HISTORY — DX: Personal history of urinary calculi: Z87.442

## 2023-05-30 HISTORY — DX: Varicose veins of bilateral lower extremities with pain: I83.813

## 2023-05-30 HISTORY — DX: Polyp of colon: K63.5

## 2023-05-30 HISTORY — DX: Malignant (primary) neoplasm, unspecified: C80.1

## 2023-05-30 HISTORY — DX: Hyperlipidemia, unspecified: E78.5

## 2023-05-30 HISTORY — DX: Other specified postprocedural states: Z98.890

## 2023-05-30 LAB — BASIC METABOLIC PANEL
Anion gap: 10 (ref 5–15)
BUN: 14 mg/dL (ref 8–23)
CO2: 25 mmol/L (ref 22–32)
Calcium: 9.4 mg/dL (ref 8.9–10.3)
Chloride: 102 mmol/L (ref 98–111)
Creatinine, Ser: 0.94 mg/dL (ref 0.61–1.24)
GFR, Estimated: >60 mL/min (ref >60)
Glucose, Bld: 134 mg/dL — ABNORMAL HIGH (ref 70–99)
Potassium: 4.3 mmol/L (ref 3.5–5.1)
Sodium: 137 mmol/L (ref 135–145)

## 2023-05-30 LAB — CBC
HCT: 44.7 % (ref 39.0–52.0)
Hemoglobin: 15.8 g/dL (ref 13.0–17.0)
MCH: 30.9 pg (ref 26.0–34.0)
MCHC: 35.3 g/dL (ref 30.0–36.0)
MCV: 87.5 fL (ref 80.0–100.0)
Platelets: 152 10*3/uL (ref 150–400)
RBC: 5.11 MIL/uL (ref 4.22–5.81)
RDW: 12.5 % (ref 11.5–15.5)
WBC: 4.2 10*3/uL (ref 4.0–10.5)
nRBC: 0 % (ref 0.0–0.2)

## 2023-05-30 LAB — URINALYSIS, COMPLETE (UACMP) WITH MICROSCOPIC
Bilirubin Urine: NEGATIVE
Glucose, UA: NEGATIVE mg/dL
Ketones, ur: NEGATIVE mg/dL
Leukocytes,Ua: NEGATIVE
Nitrite: NEGATIVE
Protein, ur: NEGATIVE mg/dL
Specific Gravity, Urine: 1.017 (ref 1.005–1.030)
pH: 6 (ref 5.0–8.0)

## 2023-05-30 LAB — TYPE AND SCREEN
ABO/RH(D): O POS
Antibody Screen: NEGATIVE

## 2023-05-30 LAB — PROTIME-INR
INR: 0.9 (ref 0.8–1.2)
Prothrombin Time: 12.7 s (ref 11.4–15.2)

## 2023-05-30 NOTE — Patient Instructions (Addendum)
Your procedure is scheduled on:  Monday January 27  Report to the Registration Desk on the 1st floor of the [facility] To find out your arrival time, please call [phone] between 1PM - 3PM on: Friday January 24  If your arrival time is 6:00 am, do not arrive before that time as the [facility] entrance doors do not open until 6:00 am.  REMEMBER: Instructions that are not followed completely may result in serious medical risk, up to and including death; or upon the discretion of your surgeon and anesthesiologist your surgery may need to be rescheduled.  Do not eat food after midnight the night before surgery.  No gum chewing or hard candies.  YOU ARE TO HAVE CLEAR LIQUIDS THE ENTIRE DAY PRIOR TO SURGERY  ( Sunday January 26)   One week prior to surgery:  Starting Monday January 20  Stop Anti-inflammatories (NSAIDS) such as Advil, Aleve, Ibuprofen, Motrin, Naproxen, Naprosyn and Aspirin based products such as Excedrin, Goody's Powder, BC Powder. Stop ANY OVER THE COUNTER supplements until after surgery.  You may however, continue to take Tylenol if needed for pain up until the day of surgery.  tadalafil (CIALIS) hold 2 days prior to surgery, last dose Friday January 24   Continue taking all of your other prescription medications up until the day of surgery.  ON THE DAY OF SURGERY DO NOT TAKE ANY MEDICATION.  No Alcohol for 24 hours before or after surgery.  No Smoking including e-cigarettes for 24 hours before surgery.  No chewable tobacco products for at least 6 hours before surgery.  No nicotine patches on the day of surgery.  Do not use any "recreational" drugs for at least a week (preferably 2 weeks) before your surgery.  Please be advised that the combination of cocaine and anesthesia may have negative outcomes, up to and including death. If you test positive for cocaine, your surgery will be cancelled.  On the morning of surgery brush your teeth with toothpaste and  water, you may rinse your mouth with mouthwash if you wish. Do not swallow any toothpaste or mouthwash.  Do not wear jewelry, make-up, hairpins, clips or nail polish.  For welded (permanent) jewelry: bracelets, anklets, waist bands, etc.  Please have this removed prior to surgery.  If it is not removed, there is a chance that hospital personnel will need to cut it off on the day of surgery.  Do not wear lotions, powders, or perfumes.   Do not shave body hair from the neck down 48 hours before surgery.  Contact lenses, hearing aids and dentures may not be worn into surgery.  Do not bring valuables to the hospital. [facility] is not responsible for any missing/lost belongings or valuables.   Notify your doctor if there is any change in your medical condition (cold, fever, infection).  Wear comfortable clothing (specific to your surgery type) to the hospital.  After surgery, you can help prevent lung complications by doing breathing exercises.  Take deep breaths and cough every 1-2 hours.   If you are being admitted to the hospital overnight, leave your suitcase in the car. After surgery it may be brought to your room.  In case of increased patient census, it may be necessary for you, the patient, to continue your postoperative care in the Same Day Surgery department.  If you are being discharged the day of surgery, you will not be allowed to drive home. You will need a responsible individual to drive you  home and stay with you for 24 hours after surgery.   If you are taking public transportation, you will need to have a responsible individual with you.  Please call the Pre-admissions Testing Dept. at [phone] if you have any questions about these instructions.  Surgery Visitation Policy:  Patients having surgery or a procedure may have two visitors.  Children under the age of 39 must have an adult with them who is not the patient.  Temporary Visitor Restrictions Due to  increasing cases of flu, RSV and COVID-19: Children ages 84 and under will not be able to visit patients in [facility] hospitals under most circumstances.  Inpatient Visitation:    Visiting hours are 7 a.m. to 8 p.m. Up to four visitors are allowed at one time in a patient room. The visitors may rotate out with other people during the day.  One visitor age 7 or older may stay with the patient overnight and must be in the room by 8 p.m.      Preparing for Surgery with CHLORHEXIDINE GLUCONATE (CHG) Soap  Chlorhexidine Gluconate (CHG) Soap  o An antiseptic cleaner that kills germs and bonds with the skin to continue killing germs even after washing  o Used for showering the night before surgery and morning of surgery  Before surgery, you can play an important role by reducing the number of germs on your skin.  CHG (Chlorhexidine gluconate) soap is an antiseptic cleanser which kills germs and bonds with the skin to continue killing germs even after washing.  Please do not use if you have an allergy to CHG or antibacterial soaps. If your skin becomes reddened/irritated stop using the CHG.  1. Shower the NIGHT BEFORE SURGERY and the MORNING OF SURGERY with CHG soap.  2. If you choose to wash your hair, wash your hair first as usual with your normal shampoo.  3. After shampooing, rinse your hair and body thoroughly to remove the shampoo.  4. Use CHG as you would any other liquid soap. You can apply CHG directly to the skin and wash gently with a scrungie or a clean washcloth.  5. Apply the CHG soap to your body only from the neck down. Do not use on open wounds or open sores. Avoid contact with your eyes, ears, mouth, and genitals (private parts). Wash face and genitals (private parts) with your normal soap.  6. Wash thoroughly, paying special attention to the area where your surgery will be performed.  7. Thoroughly rinse your body with warm water.  8. Do not shower/wash with your  normal soap after using and rinsing off the CHG soap.  9. Pat yourself dry with a clean towel.  10. Wear clean pajamas to bed the night before surgery.  11. Place clean sheets on your bed the night of your first shower and do not sleep with pets.  12. Shower again with the CHG soap on the day of surgery prior to arriving at the hospital.  13. Do not apply any deodorants/lotions/powders.  14. Please wear clean clothes to the hospital.

## 2023-05-31 LAB — URINE CULTURE

## 2023-06-01 ENCOUNTER — Ambulatory Visit: Payer: Commercial Managed Care - PPO | Admitting: Urology

## 2023-06-05 MED ORDER — HEPARIN SODIUM (PORCINE) 5000 UNIT/ML IJ SOLN
5000.0000 [IU] | INTRAMUSCULAR | Status: AC
  Administered 2023-06-06: 5000 [IU] via SUBCUTANEOUS

## 2023-06-05 MED ORDER — CEFAZOLIN SODIUM-DEXTROSE 2-4 GM/100ML-% IV SOLN
2.0000 g | INTRAVENOUS | Status: AC
  Administered 2023-06-06: 2 g via INTRAVENOUS

## 2023-06-05 MED ORDER — CHLORHEXIDINE GLUCONATE 0.12 % MT SOLN
15.0000 mL | Freq: Once | OROMUCOSAL | Status: AC
  Administered 2023-06-06: 15 mL via OROMUCOSAL

## 2023-06-05 MED ORDER — ORAL CARE MOUTH RINSE: 15.0000 mL | Freq: Once | OROMUCOSAL | Status: AC

## 2023-06-05 MED ORDER — CHLORHEXIDINE GLUCONATE 4 % EX SOLN
60.0000 mL | Freq: Once | CUTANEOUS | Status: DC
Start: 2023-06-05 — End: 2023-06-06

## 2023-06-05 MED ORDER — CHLORHEXIDINE GLUCONATE 4 % EX SOLN: 60.0000 mL | Freq: Once | CUTANEOUS | Status: DC

## 2023-06-05 MED ORDER — LACTATED RINGERS IV SOLN
INTRAVENOUS | Status: DC
Start: 2023-06-05 — End: 2023-06-06

## 2023-06-06 ENCOUNTER — Other Ambulatory Visit: Payer: Self-pay

## 2023-06-06 ENCOUNTER — Ambulatory Visit: Payer: Commercial Managed Care - PPO | Admitting: Registered Nurse

## 2023-06-06 ENCOUNTER — Ambulatory Visit: Payer: Commercial Managed Care - PPO | Admitting: Urgent Care

## 2023-06-06 ENCOUNTER — Encounter
Admission: RE | Disposition: A | Discharge status: Home / Self Care | Payer: Self-pay | Source: Home / Self Care | Attending: Urology

## 2023-06-06 ENCOUNTER — Observation Stay
Admission: RE | Admit: 2023-06-06 | Discharge: 2023-06-08 | Disposition: A | Discharge status: Home / Self Care | Payer: Commercial Managed Care - PPO | Attending: Urology | Admitting: Urology

## 2023-06-06 ENCOUNTER — Encounter: Payer: Self-pay | Admitting: Urology

## 2023-06-06 DIAGNOSIS — C61 Malignant neoplasm of prostate: Secondary | ICD-10-CM | POA: Diagnosis present

## 2023-06-06 DIAGNOSIS — Z87891 Personal history of nicotine dependence: Secondary | ICD-10-CM | POA: Insufficient documentation

## 2023-06-06 DIAGNOSIS — N3289 Other specified disorders of bladder: Secondary | ICD-10-CM

## 2023-06-06 HISTORY — PX: ROBOT ASSISTED LAPAROSCOPIC RADICAL PROSTATECTOMY: SHX5141

## 2023-06-06 LAB — ABO/RH: ABO/RH(D): O POS

## 2023-06-06 SURGERY — PROSTATECTOMY, RADICAL, ROBOT-ASSISTED, LAPAROSCOPIC
Anesthesia: General | Site: Abdomen

## 2023-06-06 MED ORDER — CHLORHEXIDINE GLUCONATE 0.12 % MT SOLN
OROMUCOSAL | Status: AC
  Filled 2023-06-06: qty 15

## 2023-06-06 MED ORDER — BUPIVACAINE HCL 0.5 % IJ SOLN
INTRAMUSCULAR | Status: DC | PRN
  Administered 2023-06-06: 18 mL

## 2023-06-06 MED ORDER — HYDROMORPHONE HCL 1 MG/ML IJ SOLN: 0.5000 mg | INTRAMUSCULAR | Status: DC | PRN

## 2023-06-06 MED ORDER — OXYBUTYNIN CHLORIDE 5 MG PO TABS: 5.0000 mg | ORAL_TABLET | Freq: Three times a day (TID) | ORAL | Status: DC | PRN

## 2023-06-06 MED ORDER — DEXAMETHASONE SODIUM PHOSPHATE 10 MG/ML IJ SOLN
INTRAMUSCULAR | Status: DC | PRN
  Administered 2023-06-06: 10 mg via INTRAVENOUS

## 2023-06-06 MED ORDER — ACETAMINOPHEN 325 MG PO TABS: 650.0000 mg | ORAL_TABLET | ORAL | Status: DC | PRN

## 2023-06-06 MED ORDER — SODIUM CHLORIDE 0.9% FLUSH
3.0000 mL | Freq: Two times a day (BID) | INTRAVENOUS | Status: DC
  Administered 2023-06-06 – 2023-06-08 (×3): 3 mL via INTRAVENOUS

## 2023-06-06 MED ORDER — HEPARIN SODIUM (PORCINE) 5000 UNIT/ML IJ SOLN
5000.0000 [IU] | Freq: Two times a day (BID) | INTRAMUSCULAR | Status: DC
  Administered 2023-06-07 – 2023-06-08 (×3): 5000 [IU] via SUBCUTANEOUS
  Filled 2023-06-06 (×3): qty 1

## 2023-06-06 MED ORDER — LIDOCAINE HCL (PF) 2 % IJ SOLN
INTRAMUSCULAR | Status: AC
  Filled 2023-06-06: qty 5

## 2023-06-06 MED ORDER — SURGIFLO WITH THROMBIN (HEMOSTATIC MATRIX KIT) OPTIME
TOPICAL | Status: DC | PRN
  Administered 2023-06-06: 1 via TOPICAL

## 2023-06-06 MED ORDER — FENTANYL CITRATE (PF) 100 MCG/2ML IJ SOLN
INTRAMUSCULAR | Status: AC
  Filled 2023-06-06: qty 2

## 2023-06-06 MED ORDER — ONDANSETRON HCL 4 MG/2ML IJ SOLN: 4.0000 mg | INTRAMUSCULAR | Status: DC | PRN

## 2023-06-06 MED ORDER — MIDAZOLAM HCL 2 MG/2ML IJ SOLN
INTRAMUSCULAR | Status: AC
  Filled 2023-06-06: qty 2

## 2023-06-06 MED ORDER — FENTANYL CITRATE (PF) 100 MCG/2ML IJ SOLN
25.0000 ug | INTRAMUSCULAR | Status: DC | PRN
  Administered 2023-06-06 (×4): 25 ug via INTRAVENOUS

## 2023-06-06 MED ORDER — HYDROCODONE-ACETAMINOPHEN 5-325 MG PO TABS
1.0000 | ORAL_TABLET | ORAL | Status: DC | PRN
Start: 2023-06-06 — End: 2023-06-08
  Administered 2023-06-06: 2 via ORAL
  Administered 2023-06-06 – 2023-06-07 (×2): 1 via ORAL
  Administered 2023-06-07: 2 via ORAL
  Administered 2023-06-07: 1 via ORAL
  Administered 2023-06-07: 2 via ORAL
  Administered 2023-06-07: 1 via ORAL
  Administered 2023-06-08: 2 via ORAL
  Administered 2023-06-08 (×2): 1 via ORAL
  Filled 2023-06-06: qty 1
  Filled 2023-06-06 (×3): qty 2
  Filled 2023-06-06 (×2): qty 1
  Filled 2023-06-06 (×2): qty 2
  Filled 2023-06-06 (×2): qty 1

## 2023-06-06 MED ORDER — PROPOFOL 10 MG/ML IV BOLUS
INTRAVENOUS | Status: DC | PRN
  Administered 2023-06-06: 200 mg via INTRAVENOUS

## 2023-06-06 MED ORDER — SEVOFLURANE IN SOLN
RESPIRATORY_TRACT | Status: AC
  Filled 2023-06-06: qty 250

## 2023-06-06 MED ORDER — DEXAMETHASONE 0.5 MG/5ML PO SOLN
2.0000 mg | Freq: Two times a day (BID) | ORAL | Status: DC
  Administered 2023-06-06 – 2023-06-08 (×4): 2 mg via ORAL
  Filled 2023-06-06 (×4): qty 20

## 2023-06-06 MED ORDER — HEPARIN SODIUM (PORCINE) 5000 UNIT/ML IJ SOLN
INTRAMUSCULAR | Status: AC
  Filled 2023-06-06: qty 1

## 2023-06-06 MED ORDER — OXYCODONE HCL 5 MG PO TABS
5.0000 mg | ORAL_TABLET | Freq: Once | ORAL | Status: DC | PRN
Start: 2023-06-06 — End: 2023-06-06

## 2023-06-06 MED ORDER — ONDANSETRON HCL 4 MG/2ML IJ SOLN
INTRAMUSCULAR | Status: AC
  Filled 2023-06-06: qty 2

## 2023-06-06 MED ORDER — EPHEDRINE SULFATE-NACL 50-0.9 MG/10ML-% IV SOSY
PREFILLED_SYRINGE | INTRAVENOUS | Status: DC | PRN
  Administered 2023-06-06: 5 mg via INTRAVENOUS
  Administered 2023-06-06: 10 mg via INTRAVENOUS

## 2023-06-06 MED ORDER — SODIUM CHLORIDE 0.9 % IV SOLN: INTRAVENOUS | Status: DC | PRN

## 2023-06-06 MED ORDER — HYDROMORPHONE HCL 1 MG/ML IJ SOLN
INTRAMUSCULAR | Status: DC | PRN
  Administered 2023-06-06 (×2): .5 mg via INTRAVENOUS

## 2023-06-06 MED ORDER — LACTATED RINGERS IV SOLN: INTRAVENOUS | Status: DC | PRN

## 2023-06-06 MED ORDER — BUPIVACAINE HCL (PF) 0.5 % IJ SOLN
INTRAMUSCULAR | Status: AC
  Filled 2023-06-06: qty 30

## 2023-06-06 MED ORDER — LIDOCAINE HCL (CARDIAC) PF 100 MG/5ML IV SOSY
PREFILLED_SYRINGE | INTRAVENOUS | Status: DC | PRN
  Administered 2023-06-06: 80 mg via INTRAVENOUS

## 2023-06-06 MED ORDER — FENTANYL CITRATE (PF) 100 MCG/2ML IJ SOLN
INTRAMUSCULAR | Status: DC | PRN
  Administered 2023-06-06: 100 ug via INTRAVENOUS

## 2023-06-06 MED ORDER — SODIUM CHLORIDE 0.9 % IV SOLN: 250.0000 mL | INTRAVENOUS | Status: AC | PRN

## 2023-06-06 MED ORDER — PROPOFOL 10 MG/ML IV BOLUS
INTRAVENOUS | Status: AC
  Filled 2023-06-06: qty 20

## 2023-06-06 MED ORDER — ACETAMINOPHEN 10 MG/ML IV SOLN
INTRAVENOUS | Status: DC | PRN
  Administered 2023-06-06: 1000 mg via INTRAVENOUS

## 2023-06-06 MED ORDER — MIDAZOLAM HCL 2 MG/2ML IJ SOLN
INTRAMUSCULAR | Status: DC | PRN
  Administered 2023-06-06: 2 mg via INTRAVENOUS

## 2023-06-06 MED ORDER — DEXMEDETOMIDINE HCL IN NACL 80 MCG/20ML IV SOLN
INTRAVENOUS | Status: DC | PRN
  Administered 2023-06-06 (×2): 12 ug via INTRAVENOUS
  Administered 2023-06-06: 8 ug via INTRAVENOUS

## 2023-06-06 MED ORDER — CEFAZOLIN SODIUM-DEXTROSE 2-4 GM/100ML-% IV SOLN
INTRAVENOUS | Status: AC
  Filled 2023-06-06: qty 100

## 2023-06-06 MED ORDER — DEXAMETHASONE SODIUM PHOSPHATE 10 MG/ML IJ SOLN
INTRAMUSCULAR | Status: AC
  Filled 2023-06-06: qty 1

## 2023-06-06 MED ORDER — SODIUM CHLORIDE 0.9% FLUSH: 3.0000 mL | INTRAVENOUS | Status: DC | PRN

## 2023-06-06 MED ORDER — ONDANSETRON HCL 4 MG/2ML IJ SOLN
INTRAMUSCULAR | Status: DC | PRN
  Administered 2023-06-06: 4 mg via INTRAVENOUS

## 2023-06-06 MED ORDER — ACETAMINOPHEN 10 MG/ML IV SOLN
INTRAVENOUS | Status: AC
  Filled 2023-06-06: qty 100

## 2023-06-06 MED ORDER — SUGAMMADEX SODIUM 200 MG/2ML IV SOLN
INTRAVENOUS | Status: DC | PRN
  Administered 2023-06-06: 400 mg via INTRAVENOUS

## 2023-06-06 MED ORDER — ROCURONIUM BROMIDE 100 MG/10ML IV SOLN
INTRAVENOUS | Status: DC | PRN
  Administered 2023-06-06: 10 mg via INTRAVENOUS
  Administered 2023-06-06: 20 mg via INTRAVENOUS
  Administered 2023-06-06: 60 mg via INTRAVENOUS
  Administered 2023-06-06: 5 mg via INTRAVENOUS
  Administered 2023-06-06: 20 mg via INTRAVENOUS

## 2023-06-06 MED ORDER — OXYCODONE HCL 5 MG/5ML PO SOLN: 5.0000 mg | Freq: Once | ORAL | Status: DC | PRN

## 2023-06-06 MED ORDER — HYDROMORPHONE HCL 1 MG/ML IJ SOLN
INTRAMUSCULAR | Status: AC
  Filled 2023-06-06: qty 1

## 2023-06-06 MED ORDER — EPHEDRINE 5 MG/ML INJ
INTRAVENOUS | Status: AC
  Filled 2023-06-06: qty 5

## 2023-06-06 SURGICAL SUPPLY — 77 items
ANCHOR TIS RET SYS 235ML (MISCELLANEOUS) ×2 IMPLANT
APPLICATOR SURGIFLO ENDO (HEMOSTASIS) ×2 IMPLANT
BAG PRESSURE INF REUSE 1000 (BAG) IMPLANT
BAG URINE DRAIN 2000ML AR STRL (UROLOGICAL SUPPLIES) ×2 IMPLANT
BLADE CLIPPER SURG (BLADE) ×2 IMPLANT
CATH FOL 2WAY LX 18X5 (CATHETERS) ×3 IMPLANT
CATH FOL 2WAY LX 20X5 (CATHETERS) IMPLANT
CHLORAPREP W/TINT 26 (MISCELLANEOUS) ×3 IMPLANT
CLIP LIGATING HEM O LOK PURPLE (MISCELLANEOUS) ×6 IMPLANT
COVER TIP SHEARS 8 DVNC (MISCELLANEOUS) ×3 IMPLANT
DERMABOND ADVANCED .7 DNX12 (GAUZE/BANDAGES/DRESSINGS) ×2 IMPLANT
DRAIN CHANNEL JP 15F RND 3/16 (MISCELLANEOUS) IMPLANT
DRAIN CHANNEL JP 19F RND 3/16 (MISCELLANEOUS) IMPLANT
DRAPE ARM DVNC X/XI (DISPOSABLE) ×8 IMPLANT
DRAPE COLUMN DVNC XI (DISPOSABLE) ×2 IMPLANT
DRAPE LEGGINS SURG 28X43 STRL (DRAPES) ×3 IMPLANT
DRAPE SHEET LG 3/4 BI-LAMINATE (DRAPES) ×2 IMPLANT
DRAPE SURG 17X11 SM STRL (DRAPES) ×8 IMPLANT
DRAPE UNDER BUTTOCK W/FLU (DRAPES) ×2 IMPLANT
DRSG TELFA 3X8 NADH STRL (GAUZE/BANDAGES/DRESSINGS) ×2 IMPLANT
ELECT REM PT RETURN 9FT ADLT (ELECTROSURGICAL) ×2
ELECTRODE REM PT RTRN 9FT ADLT (ELECTROSURGICAL) ×1 IMPLANT
EVACUATOR SILICONE 100CC (DRAIN) IMPLANT
FORCEPS BPLR 8 MD DVNC XI (FORCEP) ×2 IMPLANT
FORCEPS BPLR FENES DVNC XI (FORCEP) ×2 IMPLANT
FORCEPS PROGRASP DVNC XI (FORCEP) ×2 IMPLANT
GLOVE BIOGEL PI IND STRL 7.5 (GLOVE) ×6 IMPLANT
GOWN STRL REUS W/ TWL LRG LVL3 (GOWN DISPOSABLE) ×4 IMPLANT
GOWN STRL REUS W/ TWL XL LVL3 (GOWN DISPOSABLE) ×4 IMPLANT
GRASPER SUT TROCAR 14GX15 (MISCELLANEOUS) ×2 IMPLANT
HEMOSTAT SURGICEL 2X14 (HEMOSTASIS) ×1 IMPLANT
HOLDER FOLEY CATH W/STRAP (MISCELLANEOUS) ×2 IMPLANT
IRRIGATION STRYKERFLOW (MISCELLANEOUS) ×1 IMPLANT
IRRIGATOR STRYKERFLOW (MISCELLANEOUS) ×2
KIT PINK PAD W/HEAD ARE REST (MISCELLANEOUS) ×2
KIT PINK PAD W/HEAD ARM REST (MISCELLANEOUS) ×1 IMPLANT
LABEL OR SOLS (LABEL) ×2 IMPLANT
MANIFOLD NEPTUNE II (INSTRUMENTS) ×2 IMPLANT
NDL HYPO 22X1.5 SAFETY MO (MISCELLANEOUS) ×1 IMPLANT
NDL INSUFFLATION 14GA 120MM (NEEDLE) ×1 IMPLANT
NEEDLE HYPO 22X1.5 SAFETY MO (MISCELLANEOUS) ×2
NEEDLE INSUFFLATION 14GA 120MM (NEEDLE) ×2
OBTURATOR OPTICAL STND 8 DVNC (TROCAR) ×2
OBTURATOR OPTICALSTD 8 DVNC (TROCAR) ×1 IMPLANT
PACK LAP CHOLECYSTECTOMY (MISCELLANEOUS) ×2 IMPLANT
RELOAD STAPLE 60 2.6 WHT THN (STAPLE) IMPLANT
SCISSORS MNPLR CVD DVNC XI (INSTRUMENTS) ×3 IMPLANT
SEAL UNIV 5-12 XI (MISCELLANEOUS) ×8 IMPLANT
SET TUBE SMOKE EVAC HIGH FLOW (TUBING) ×2 IMPLANT
SOL ELECTROSURG ANTI STICK (MISCELLANEOUS) ×2
SOLUTION ELECTROSURG ANTI STCK (MISCELLANEOUS) ×1 IMPLANT
SPONGE T-LAP 4X18 ~~LOC~~+RFID (SPONGE) ×2 IMPLANT
SPONGE VERSALON 4X4 4PLY (MISCELLANEOUS) IMPLANT
STAPLE ECHEON FLEX 60 POW ENDO (STAPLE) ×1 IMPLANT
STAPLER RELOAD WHITE 60MM (STAPLE) ×2
STAPLER SKIN PROX 35W (STAPLE) ×2 IMPLANT
STRAP SAFETY 5IN WIDE (MISCELLANEOUS) ×2 IMPLANT
SURGIFLO W/THROMBIN 8M KIT (HEMOSTASIS) ×2 IMPLANT
SURGILUBE 2OZ TUBE FLIPTOP (MISCELLANEOUS) ×2 IMPLANT
SUT ETHILON 3-0 FS-10 30 BLK (SUTURE)
SUT MNCRL 4-0 27 PS-2 XMFL (SUTURE) ×4
SUT MNCRL 4-0 27XMFL (SUTURE) ×4
SUT PROLENE 5 0 RB 1 DA (SUTURE) IMPLANT
SUT SILK 2 0 SH (SUTURE) IMPLANT
SUT STRATA 3-0 15 RB-1 (SUTURE) ×2 IMPLANT
SUT STRATA 3-0 15 RB-1.5 (SUTURE) ×4 IMPLANT
SUT VIC AB 0 CT1 36 (SUTURE) ×3 IMPLANT
SUT VIC AB 2-0 CT1 (SUTURE) IMPLANT
SUT VICRYL 0 UR6 27IN ABS (SUTURE) ×2 IMPLANT
SUTURE EHLN 3-0 FS-10 30 BLK (SUTURE) IMPLANT
SUTURE MNCRL 4-0 27XMF (SUTURE) ×2 IMPLANT
SYR TOOMEY 50ML (SYRINGE) ×2 IMPLANT
TAPE CLOTH 3X10 WHT NS LF (GAUZE/BANDAGES/DRESSINGS) ×3 IMPLANT
TROCAR Z-THREAD FIOS 12X100MM (TROCAR) ×2 IMPLANT
TROCAR Z-THREAD FIOS 5X100MM (TROCAR) ×2 IMPLANT
WATER STERILE IRR 1000ML POUR (IV SOLUTION) ×1 IMPLANT
WATER STERILE IRR 3000ML UROMA (IV SOLUTION) ×2 IMPLANT

## 2023-06-06 NOTE — Progress Notes (Signed)
   06/06/23 0930  Spiritual Encounters  Type of Visit Initial  Care provided to: Patient  Conversation partners present during encounter Nurse  Referral source Chaplain assessment  Reason for visit Routine spiritual support  OnCall Visit No  Spiritual Framework  Presenting Themes Courage hope and growth;Community and relationships;Meaning/purpose/sources of inspiration  Community/Connection Family;Friend(s);Spiritual leader  Interventions  Spiritual Care Interventions Made Established relationship of care and support;Compassionate presence;Reflective listening;Encouragement;Prayer  Intervention Outcomes  Outcomes Connection to spiritual care;Awareness around self/spiritual resourses;Connected to spiritual community  Spiritual Care Plan  Spiritual Care Issues Still Outstanding No further spiritual care needs at this time (see row info)

## 2023-06-06 NOTE — Anesthesia Preprocedure Evaluation (Addendum)
Anesthesia Evaluation  Patient identified by MRN, date of birth, ID band Patient awake  General Assessment Comment:Discussed patients elevated blood pressure. Patient states he takes his BP often at home, usually runs 110s-120s/70s-80s. His takes his log to PCP and she is comfortable with where he is at. He believes he just gets anxious going to the dr   Reviewed: Allergy & Precautions, NPO status , Patient's Chart, lab work & pertinent test results  History of Anesthesia Complications (+) PONV and history of anesthetic complications  Airway Mallampati: III  TM Distance: >3 FB Neck ROM: full    Dental no notable dental hx.    Pulmonary former smoker   Pulmonary exam normal        Cardiovascular Exercise Tolerance: Good negative cardio ROS Normal cardiovascular exam     Neuro/Psych negative neurological ROS  negative psych ROS   GI/Hepatic negative GI ROS, Neg liver ROS,,,  Endo/Other  negative endocrine ROS    Renal/GU Renal disease (stones)  negative genitourinary   Musculoskeletal negative musculoskeletal ROS (+)    Abdominal   Peds  Hematology negative hematology ROS (+)   Anesthesia Other Findings Past Medical History: No date: Cancer (HCC) No date: History of kidney stones No date: HLD (hyperlipidemia) No date: Kidney stone No date: Polyp of descending colon No date: PONV (postoperative nausea and vomiting) No date: Varicose veins of both lower extremities with pain  Past Surgical History: 02/22/2023: COLONOSCOPY WITH PROPOFOL; N/A     Comment:  Procedure: COLONOSCOPY WITH PROPOFOL;  Surgeon: Toney Reil, MD;  Location: [facility] ENDOSCOPY;  Service:               Gastroenterology;  Laterality: N/A; 2014: HAND SURGERY 02/22/2023: POLYPECTOMY     Comment:  Procedure: POLYPECTOMY;  Surgeon: Toney Reil,               MD;  Location: [facility] ENDOSCOPY;  Service:                Gastroenterology;;  BMI    Body Mass Index: 32.32 kg/m      Reproductive/Obstetrics negative OB ROS                             Anesthesia Physical Anesthesia Plan  ASA: 3  Anesthesia Plan: General ETT   Post-op Pain Management: Toradol IV (intra-op)*, Ofirmev IV (intra-op)* and Dilaudid IV   Induction: Intravenous  PONV Risk Score and Plan: 3 and Ondansetron, Dexamethasone, Midazolam and Treatment may vary due to age or medical condition  Airway Management Planned: Oral ETT  Additional Equipment:   Intra-op Plan:   Post-operative Plan: Extubation in OR  Informed Consent: I have reviewed the patients History and Physical, chart, labs and discussed the procedure including the risks, benefits and alternatives for the proposed anesthesia with the patient or authorized representative who has indicated his/her understanding and acceptance.     Dental Advisory Given  Plan Discussed with: Anesthesiologist, CRNA and Surgeon  Anesthesia Plan Comments: (Patient consented for risks of anesthesia including but not limited to:  - adverse reactions to medications - damage to eyes, teeth, lips or other oral mucosa - nerve damage due to positioning  - sore throat or hoarseness - Damage to heart, brain, nerves, lungs, other parts of body or loss of life  Patient voiced understanding and assent.)  Anesthesia Quick Evaluation

## 2023-06-06 NOTE — Anesthesia Procedure Notes (Signed)
Procedure Name: Intubation Date/Time: 06/06/2023 11:40 AM  Performed by: Lily Lovings, CRNAPre-anesthesia Checklist: Patient identified, Patient being monitored, Timeout performed, Emergency Drugs available and Suction available Patient Re-evaluated:Patient Re-evaluated prior to induction Oxygen Delivery Method: Circle system utilized Preoxygenation: Pre-oxygenation with 100% oxygen Induction Type: IV induction Ventilation: Mask ventilation without difficulty and Oral airway inserted - appropriate to patient size Laryngoscope Size: 3 and McGrath Grade View: Grade II Tube type: Oral Tube size: 7.5 mm Number of attempts: 1 Airway Equipment and Method: Stylet Placement Confirmation: ETT inserted through vocal cords under direct vision, positive ETCO2 and breath sounds checked- equal and bilateral Secured at: 23 cm Tube secured with: Tape Dental Injury: Teeth and Oropharynx as per pre-operative assessment

## 2023-06-06 NOTE — Op Note (Signed)
Date of procedure: 06/06/2023   Preoperative diagnosis:  Prostate cancer   Postoperative diagnosis:  Same   Procedure: Da Vinci robotic radical prostatectomy   Surgeon: Legrand Rams, MD   Assistant: Vanna Scotland, MD  (An assistant was required for this surgical procedure.  The duties of the assistant included but were not limited to suctioning, passing suture, camera manipulation, retraction. This procedure would not be able to be performed without an assistant)   Anesthesia: General   Complications: None   Intraoperative findings:  1.  Very challenging case secondary to asymmetric and irregular large median lobe 2.  Watertight vesicourethral anastomosis   EBL: 150 cc   Specimens:  1.  Prostate and seminal vesicles   Drains: 20 French 2-way Foley   Indication: Derik Fults is a 68 y.o. male with  prostate cancer identified on biopsy who opted for radical prostatectomy.  We discussed treatment options including surgery or radiation, and he elected for robotic prostatectomy.  After reviewing the management options for treatment, they elected to proceed with the above surgical procedure(s). We have discussed the potential benefits and risks of the procedure, side effects of the proposed treatment, the likelihood of the patient achieving the goals of the procedure, and any potential problems that might occur during the procedure or recuperation. Informed consent has been obtained. We specifically discussed the risks of bleeding, infection, bowel injury, injury to surrounding structures, incontinence, erectile dysfunction, urine leak, possible need for prolonged foley or drain placement, need for long term PSA monitoring, risk of recurrence of disease, and possible need for salvage radiation in the future.   Description of procedure:   The patient was taken to the operating room and general anesthesia was induced. SCDs were placed for DVT prophylaxis, and 5000 units subcutaneous  heparin were given.  The patient was placed in the dorsal lithotomy position, carefully padded with the arms against the sides, prepped and draped in the usual sterile fashion, and preoperative antibiotics(Ancef) were administered. A preoperative time-out was performed.    On the field, an 61 [place] Foley catheter passed easily into the bladder with return of clear yellow urine.  An 8 mm incision was made above the umbilicus, and a Veress needle was used to obtain access to the peritoneum.  The abdomen was insufflated to 15 mmHg, and the robotic ports were placed in standard fashion under direct vision. A Carter-Thomason device was used to pre-place an 0-vicryl suture in the right lateral 11mm assistant port under direct vision.  There was some adhesions in the left lower quadrant.  Lidocaine was injected into all port sites prior to placement.  The patient was then placed in steep Trendelenburg position and the robot was docked.   The adhesions in the left lower quadrant were carefully taken down with scissors.  I started by entering the space of Retzius and taking down the bladder.  Once this was free to the vas deferens bilaterally, the periprostatic fat was cleaned off the prostate and sent for permanent.  The endopelvic fascia was opened bilaterally, and muscle fibers spared laterally.  The periprostatic ligaments were carefully taken down.  The DVC was isolated, and ligated using a 60 mm load vascular stapler.    I then turned my attention to the anterior bladder neck, and this was incised down to the level of the Foley catheter.    The Foley catheter was removed from the bladder and gently held upward for traction. There was an irregular and large intravesical median  lobe that made this portion of the case extremely challenging.  I attempted to identify the plane between the posterior prostate and the bladder neck to identify the seminal vesicles, but this was extremely challenging with the irregular  intravesical lobe.  I was unable to identify by the seminal vesicles, and was concerned that I had skived through the posterior prostate.  The prostate was then gently retracted superiorly, and the apex was dissected free.  Once the urethra was identified, the use of electrocautery was minimized.  The urethra was sharply entered and the Foley catheter removed.  The posterior urethral plate was divided. The specimen was placed in an Endo Catch bag and moved out of the field.  There was good hemostasis in the pelvis.  I then returned to the bladder neck and was able to identify some residual prostate tissue and median lobe attached to the bladder neck which was dissected free and sent as bladder neck margin.   A 3-0 V lock stitch was used to reapproximate the tissue behind the bladder and posteriorly to the urethra as described by Rocco in order to remove tension from the anastomosis.  Two 3-0 V lock stitches were connected and used for the urethrovesical anastomosis.  A running anastomosis was performed circumferentially and there appeared to be good approximation of the mucosa on both sides.  A new 20 French Foley passed easily into the bladder. With the sutures tied anteriorly and 12cc in the balloon, 120 cc normal saline were instilled with no leakage noted in the operative field.    There was good hemostasis.  A small piece of Surgicel was placed on either side of the anastomosis along the endopelvic fascia in addition to Surgi-Flo, as well as in the obturator fossa bilaterally. All instruments were removed and the robot was undocked.   The supraumbilical incision was extended superiorly, and the specimen was removed.  The pre-placed fascial suture in the 11mm right lateral assistant port was tied down. The fascia of the small midline incision was closed using a running 0 Vicryl.  All port sites were copiously irrigated.  There was good hemostasis.  A running 4-0 Monocryl was used to close all incisions,  and Dermabond was applied.   All counts were correct, and the patient was awakened and taken to PACU in stable condition.   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

## 2023-06-06 NOTE — H&P (Signed)
06/06/23 11:37 AM   Brandon Shields. [birth year] [id number]  CC: Prostate cancer  HPI: Healthy 68 year old male who presented with an elevated PSA of 5.3, and opted for a prostate MRI.  This showed a 60 g prostate with 2 PI-RADS 4 lesions in the right peripheral zone and left peripheral zone, no evidence of metastatic disease or lymphadenopathy.  He underwent MRI fusion biopsy with Dr. Apolinar Junes on 04/29/2023, median lobe was noted, and biopsy showed Gleason score 4+4=8 prostate adenocarcinoma in the ROI, as well as additional unfavorable intermediate risk disease on the right side of the prostate.  Max core involvement was 20%.   He denies any prior abdominal surgeries.  No significant urinary symptoms at baseline.  Has mild ED, has never tried medications but interested in a trial of Cialis.   PMH: Past Medical History:  Diagnosis Date   Cancer (HCC)    History of kidney stones    HLD (hyperlipidemia)    Kidney stone    Polyp of descending colon    PONV (postoperative nausea and vomiting)    Varicose veins of both lower extremities with pain     Surgical History: Past Surgical History:  Procedure Laterality Date   COLONOSCOPY WITH PROPOFOL N/A 02/22/2023   Procedure: COLONOSCOPY WITH PROPOFOL;  Surgeon: Toney Reil, MD;  Location: [facility] ENDOSCOPY;  Service: Gastroenterology;  Laterality: N/A;   HAND SURGERY  2014   POLYPECTOMY  02/22/2023   Procedure: POLYPECTOMY;  Surgeon: Toney Reil, MD;  Location: [facility] ENDOSCOPY;  Service: Gastroenterology;;     Family History: Family History  Problem Relation Age of Onset   Psoriasis Mother    Diabetes Father    COPD Father    Diabetes Sister    Psoriasis Brother     Social History:  reports that he has quit smoking. He has never used smokeless tobacco. He reports current alcohol use. He reports that he does not use drugs.  Physical Exam: BP (!) 168/83   Pulse 78   Temp 97.9 F (36.6 C) (Tympanic)   Resp  18   Ht 6\' 1"  (1.854 m)   Wt 111.1 kg   SpO2 97%   BMI 32.32 kg/m    Constitutional:  Alert and oriented, No acute distress. Cardiovascular: Regular rate and rhythm Respiratory: Clear to auscultation bilaterally GI: Abdomen is soft, nontender, nondistended, no abdominal masses   Assessment & Plan:   68 year old male with high risk prostate cancer opted for radical prostatectomy and lymph node dissection.  We discussed robotic prostatectomy + lymphadenectomy at length.  The procedure takes 3 to 4 hours, and patient's typically discharge home on post-op day #1.  A Foley catheter is left in place for 7 to 10 days to allow for healing of the vesicourethral anastomosis.  There is a small risk of bleeding, infection, damage to surrounding structures or bowel, hernia, DVT/PE, or serious cardiac or pulmonary complications.  We discussed at length post-op side effects including erectile dysfunction, and the importance of pre-operative erectile function on long-term outcomes.  Even with a nerve sparing approach, there is an approximately 25% rate of permanent erectile dysfunction.  We also discussed postop urinary incontinence at length.  We expect patients to have stress incontinence post-operatively that will improve over period of weeks to months.  Less than 10% of men will require a pad at 1 year after surgery.  Patients will need to avoid heavy lifting and strenuous activity for 3 to 4 weeks, but most  men return to their baseline activity status by 6 weeks.  Robotic radical prostatectomy and bilateral pelvic lymph node dissection today  Legrand Rams, MD 06/06/2023  [facility] [address] [place], [place] [address] [phone]

## 2023-06-06 NOTE — Anesthesia Postprocedure Evaluation (Signed)
Anesthesia Post Note  Patient: Janathan Bribiesca.  Procedure(s) Performed: XI ROBOTIC ASSISTED LAPAROSCOPIC RADICAL PROSTATECTOMY (Abdomen)  Patient location during evaluation: PACU Anesthesia Type: General Level of consciousness: awake and alert Pain management: pain level controlled Vital Signs Assessment: post-procedure vital signs reviewed and stable Respiratory status: spontaneous breathing, nonlabored ventilation, respiratory function stable and patient connected to nasal cannula oxygen Cardiovascular status: blood pressure returned to baseline and stable Postop Assessment: no apparent nausea or vomiting Anesthetic complications: no  No notable events documented.   Last Vitals:  Vitals:   06/06/23 1519 06/06/23 1530  BP:  115/68  Pulse: 76 69  Resp: 20 16  Temp: 36.8 C   SpO2: 96% 95%    Last Pain:  Vitals:   06/06/23 1530  TempSrc:   PainSc: 5                  Stephanie Coup

## 2023-06-06 NOTE — Plan of Care (Signed)
  Problem: Education: Goal: Knowledge of the procedure and recovery process will improve Outcome: Progressing   Problem: Bowel/Gastric: Goal: Gastrointestinal status for postoperative course will improve Outcome: Progressing   Problem: Pain Management: Goal: General experience of comfort will improve Outcome: Progressing   Problem: Skin Integrity: Goal: Demonstration of wound healing without infection will improve Outcome: Progressing   Problem: Urinary Elimination: Goal: Ability to avoid or minimize complications of infection will improve Outcome: Progressing Goal: Ability to achieve and maintain urine output will improve Outcome: Progressing   Problem: Education: Goal: Knowledge of General Education information will improve Description: Including pain rating scale, medication(s)/side effects and non-pharmacologic comfort measures Outcome: Progressing   Problem: Clinical Measurements: Goal: Ability to maintain clinical measurements within normal limits will improve Outcome: Progressing Goal: Will remain free from infection Outcome: Progressing Goal: Respiratory complications will improve Outcome: Progressing Goal: Cardiovascular complication will be avoided Outcome: Progressing   Problem: Nutrition: Goal: Adequate nutrition will be maintained Outcome: Progressing   Problem: Elimination: Goal: Will not experience complications related to bowel motility Outcome: Progressing   Problem: Pain Managment: Goal: General experience of comfort will improve and/or be controlled Outcome: Progressing   Problem: Safety: Goal: Ability to remain free from injury will improve Outcome: Progressing   Problem: Skin Integrity: Goal: Risk for impaired skin integrity will decrease Outcome: Progressing

## 2023-06-06 NOTE — Transfer of Care (Signed)
Immediate Anesthesia Transfer of Care Note  Patient: Brandon Shields.  Procedure(s) Performed: XI ROBOTIC ASSISTED LAPAROSCOPIC RADICAL PROSTATECTOMY (Abdomen)  Patient Location: PACU  Anesthesia Type:General  Level of Consciousness: drowsy and patient cooperative  Airway & Oxygen Therapy: Patient Spontanous Breathing and Patient connected to nasal cannula oxygen  Post-op Assessment: Report given to RN and Patient moving all extremities  Post vital signs: Reviewed and stable  Last Vitals:  Vitals Value Taken Time  BP 127/79 06/06/23 1455  Temp    Pulse 74 06/06/23 1455  Resp 14 06/06/23 1455  SpO2 97 % 06/06/23 1455  Vitals shown include unfiled device data.  Last Pain:  Vitals:   06/06/23 1455  TempSrc:   PainSc: 0-No pain         Complications: No notable events documented.

## 2023-06-06 NOTE — Plan of Care (Signed)
Problem: Pain Management: Goal: General experience of comfort will improve Outcome: Progressing   Problem: Skin Integrity: Goal: Demonstration of wound healing without infection will improve Outcome: Progressing

## 2023-06-07 ENCOUNTER — Encounter: Payer: Self-pay | Admitting: Urology

## 2023-06-07 DIAGNOSIS — C61 Malignant neoplasm of prostate: Secondary | ICD-10-CM | POA: Diagnosis not present

## 2023-06-07 LAB — CBC
HCT: 40.1 % (ref 39.0–52.0)
Hemoglobin: 14 g/dL (ref 13.0–17.0)
MCH: 30.3 pg (ref 26.0–34.0)
MCHC: 34.9 g/dL (ref 30.0–36.0)
MCV: 86.8 fL (ref 80.0–100.0)
Platelets: 249 10*3/uL (ref 150–400)
RBC: 4.62 MIL/uL (ref 4.22–5.81)
RDW: 12.5 % (ref 11.5–15.5)
WBC: 12.4 10*3/uL — ABNORMAL HIGH (ref 4.0–10.5)
nRBC: 0 % (ref 0.0–0.2)

## 2023-06-07 LAB — BASIC METABOLIC PANEL
Anion gap: 7 (ref 5–15)
BUN: 13 mg/dL (ref 8–23)
CO2: 26 mmol/L (ref 22–32)
Calcium: 9.1 mg/dL (ref 8.9–10.3)
Chloride: 103 mmol/L (ref 98–111)
Creatinine, Ser: 0.84 mg/dL (ref 0.61–1.24)
GFR, Estimated: >60 mL/min (ref >60)
Glucose, Bld: 146 mg/dL — ABNORMAL HIGH (ref 70–99)
Potassium: 4.7 mmol/L (ref 3.5–5.1)
Sodium: 136 mmol/L (ref 135–145)

## 2023-06-07 NOTE — Progress Notes (Signed)
Brief Urology Progress Note  Patient is tolerating PO and has ambulated with nursing. He is requiring assistance with ambulation and has to climb multiple steps at home; prefers to stay overnight to increase independence with ambulation. Will reassess in AM.  Carman Ching, PA-C 06/07/23  4:03 PM

## 2023-06-07 NOTE — TOC CM/SW Note (Signed)
Transition of Care [facility]) - Inpatient Brief Assessment   Patient Details  Name: Brandon Shields. MRN: [id number] Date of Birth: [birth year]  Transition of Care [facility]) CM/SW Contact:    Chapman Fitch, RN Phone Number: 06/07/2023, 3:34 PM   Clinical Narrative:   Transition of Care [facility]) Screening Note   Patient Details  Name: Brandon Shields. Date of Birth: [birth year]   Transition of Care [facility]) CM/SW Contact:    Chapman Fitch, RN Phone Number: 06/07/2023, 3:34 PM    Transition of Care Department [facility]) has reviewed patient and no TOC needs have been identified at this time.. If new patient transition needs arise, please place a TOC consult.    Transition of Care Asessment: Insurance and Status: Insurance coverage has been reviewed Patient has primary care physician: Yes     Prior/Current Home Services: No current home services Social Drivers of Health Review: SDOH reviewed no interventions necessary Readmission risk has been reviewed: Yes Transition of care needs: no transition of care needs at this time

## 2023-06-07 NOTE — Progress Notes (Signed)
Urology Inpatient Progress Note  Subjective: No acute events overnight.  He is afebrile, VSS. WBC count up, 12.4.  Hemoglobin down, 14.0.  Creatinine stable, 0.84. Foley catheter in place draining clear, pink urine. He is passing flatus. No N/V. Pain 4/10. He has not yet ambulated.  Anti-infectives: Anti-infectives (From admission, onward)    Start     Dose/Rate Route Frequency Ordered Stop   06/05/23 2245  ceFAZolin (ANCEF) IVPB 2g/100 mL premix        2 g 200 mL/hr over 30 Minutes Intravenous 30 min pre-op 06/05/23 2245 06/06/23 1230       Current Facility-Administered Medications  Medication Dose Route Frequency Provider Last Rate Last Admin   0.9 %  sodium chloride infusion  250 mL Intravenous PRN Sondra Come, MD       acetaminophen (TYLENOL) tablet 650 mg  650 mg Oral Q4H PRN Sondra Come, MD       dexamethasone (DECADRON) 0.5 MG/5ML solution 2 mg  2 mg Oral BID Sondra Come, MD   2 mg at 06/07/23 0849   heparin injection 5,000 Units  5,000 Units Subcutaneous Q12H Sondra Come, MD   5,000 Units at 06/07/23 0849   HYDROcodone-acetaminophen (NORCO/VICODIN) 5-325 MG per tablet 1-2 tablet  1-2 tablet Oral Q4H PRN Sondra Come, MD   2 tablet at 06/07/23 0406   HYDROmorphone (DILAUDID) injection 0.5-1 mg  0.5-1 mg Intravenous Q2H PRN Sondra Come, MD       ondansetron [employer]) injection 4 mg  4 mg Intravenous Q4H PRN Sondra Come, MD       oxybutynin (DITROPAN) tablet 5 mg  5 mg Oral Q8H PRN Sondra Come, MD       sodium chloride flush (NS) 0.9 % injection 3 mL  3 mL Intravenous Q12H Legrand Rams C, MD   3 mL at 06/06/23 2158   sodium chloride flush (NS) 0.9 % injection 3 mL  3 mL Intravenous PRN Sondra Come, MD       Objective: Vital signs in last 24 hours: Temp:  [97.4 F (36.3 C)-98.3 F (36.8 C)] 98.2 F (36.8 C) (01/28 0847) Pulse Rate:  [64-85] 67 (01/28 0847) Resp:  [14-20] 17 (01/28 0847) BP: (110-168)/(60-83) 126/60 (01/28  0847) SpO2:  [92 %-97 %] 95 % (01/28 0847) Weight:  [111.1 kg] 111.1 kg (01/27 0910)  Intake/Output from previous day: 01/27 0701 - 01/28 0700 In: 2445 [P.O.:975; I.V.:1270; IV Piggyback:200] Out: 1400 [Urine:1200; Blood:200] Intake/Output this shift: No intake/output data recorded.  Physical Exam Vitals and nursing note reviewed.  Constitutional:      General: He is not in acute distress.    Appearance: He is not ill-appearing, toxic-appearing or diaphoretic.  HENT:     Head: Normocephalic and atraumatic.  Pulmonary:     Effort: Pulmonary effort is normal. No respiratory distress.  Abdominal:     Comments: Abdomen is soft with appropriate postoperative tenderness, no rigidity or rebound.  Incisions clean, dry, and intact with overlying surgical adhesive.  Skin:    General: Skin is warm and dry.  Neurological:     Mental Status: He is alert and oriented to person, place, and time.  Psychiatric:        Mood and Affect: Mood normal.        Behavior: Behavior normal.    Lab Results:  Recent Labs    06/07/23 0610  WBC 12.4*  HGB 14.0  HCT 40.1  PLT 249  BMET Recent Labs    06/07/23 0610  NA 136  K 4.7  CL 103  CO2 26  GLUCOSE 146*  BUN 13  CREATININE 0.84  CALCIUM 9.1   Assessment & Plan: 68 year old male POD 1 from robotic assisted laparoscopic radical prostatectomy with Dr. Richardo Hanks for management of prostate cancer.  He is clinically improving this morning.  Pain is well-controlled and he is passing flatus.  Anticipated changes on a.m. labs.  Plan: -Advance diet -Ambulate -If pain remains well-controlled, he tolerates p.o., and is ambulatory, he will be ready for discharge this afternoon  Carman Ching, PA-C 06/07/2023

## 2023-06-08 DIAGNOSIS — C61 Malignant neoplasm of prostate: Secondary | ICD-10-CM | POA: Diagnosis not present

## 2023-06-08 MED ORDER — OXYBUTYNIN CHLORIDE 5 MG PO TABS: 5.0000 mg | ORAL_TABLET | Freq: Three times a day (TID) | ORAL | 0 refills | Status: DC | PRN

## 2023-06-08 MED ORDER — HYDROCODONE-ACETAMINOPHEN 5-325 MG PO TABS: 1.0000 | ORAL_TABLET | ORAL | 0 refills | Status: DC | PRN

## 2023-06-08 MED ORDER — DOCUSATE SODIUM 100 MG PO CAPS
100.0000 mg | ORAL_CAPSULE | Freq: Every day | ORAL | Status: DC
  Administered 2023-06-08: 100 mg via ORAL
  Filled 2023-06-08: qty 1

## 2023-06-08 MED ORDER — DOCUSATE SODIUM 100 MG PO CAPS: 200.0000 mg | ORAL_CAPSULE | Freq: Every day | ORAL | 0 refills | Status: DC

## 2023-06-08 NOTE — Discharge Summary (Signed)
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 Briefly, Brandon Rossa. is a 68 y.o. year old patient with unfavorable intermediate risk disease of the prostate who underwent robotic assisted prostatectomy on June 06, 2023 with Dr. Richardo Hanks.  Postprocedural course was as expected and uneventful.Marland Kitchen   Hospital Course: Patient tolerated the procedure well.  He was then transferred to the floor after an uneventful PACU stay.  His hospital course was uncomplicated.  On POD#2 he had met discharge criteria: was eating a regular diet, was up and ambulating independently and pain was well controlled and he was ready to for discharge.  Physical Exam: Constitutional:  Well nourished. Alert and oriented, No acute distress. HEENT: [place] AT, moist mucus membranes.  Trachea midline, no masses. Cardiovascular: No clubbing, cyanosis, or edema. Respiratory: Normal respiratory effort, no increased work of breathing. GI: Abdomen is soft, non tender, mildly distended, incisions are clean and dry.   GU: No CVA tenderness.  No bladder fullness or masses.  Patient with uncircumcised phallus. Foreskin easily retracted.  Foley catheter in place draining light pink urine.   Neurologic: Grossly intact, no focal deficits, moving all 4 extremities. Psychiatric: Normal mood and affect.    Laboratory values:  Recent Labs    06/07/23 0610  WBC 12.4*  HGB 14.0  HCT 40.1    Recent Labs    06/07/23 0610  NA 136  K 4.7  CL 103  CO2 26  GLUCOSE 146*  BUN 13  CREATININE 0.84  CALCIUM 9.1   No results for input(s): "LABPT", "INR" in the last 72 hours. No results for input(s): "LABURIN" in the last 72 hours. Results for orders placed or performed during the hospital encounter of 05/30/23  Urine Culture     Status: Abnormal   Collection Time: 05/30/23  2:22 PM   Specimen: Urine, Clean Catch  Result Value Ref Range Status   Specimen Description   Final    URINE, CLEAN CATCH Performed at [facility], [address] [place], [place] [address]    Special Requests   Final    NONE Performed at [facility], [address] [place], [place] [address]    Culture MULTIPLE SPECIES PRESENT, SUGGEST RECOLLECTION (A)  Final   Report Status 05/31/2023 FINAL  Final    Disposition: Home  Discharge instruction: The patient was instructed to be ambulatory but told to refrain from heavy lifting, strenuous activity, or driving.   Discharge medications:  Allergies as of 06/08/2023       Reactions   Codeine Rash, Itching        Medication List     TAKE these medications    dexamethasone 0.5 MG/5ML solution Commonly known as: DECADRON Take 2 mg by mouth 2 (two) times daily.   docusate sodium 100 MG capsule Commonly known as: COLACE Take 2 capsules (200 mg total) by mouth daily.   HYDROcodone-acetaminophen 5-325 MG tablet Commonly known as: NORCO/VICODIN Take 1-2 tablets by mouth every 4 (  four) hours as needed for moderate pain (pain score 4-6).   oxybutynin 5 MG tablet Commonly known as: DITROPAN Take 1 tablet (5 mg total) by mouth every 8 (eight) hours as needed for bladder spasms.   tadalafil 5 MG tablet Commonly known as: CIALIS Take 1-2 tablets (5-10 mg total) by mouth daily as needed for erectile dysfunction (take 45 minutes prior to sexual activity).        Followup:    Has appointment on June 13, 2023 in  our medicine office at 8:40 for catheter removal.

## 2023-06-08 NOTE — Discharge Instructions (Addendum)
Foley Catheter Care and Patient Education  Perform catheter care every day.  You can do this while in the shower, but NOT while taking a tub bath.  You will need the following supplies: -mild soap, such as Dove -water -a clean washcloth (not one already used for bathing) or a 4x4 piece of gauze -1 Cath-Secure -night drainage bag -2 alcohol swabs  Was you hands thoroughly with soap and water Using mild soap and water, clan your genital area Men should retract the foreskin, if needed, and clean the area, including the penis Women should separate the labia, and clean the area from front to back  3. Clean your urinary opening, which is where the catheter enters your body. 4. Clean the catheter from where it enters your body and then down, away from your body.  Hold the catheter at the point it enters your body so that you don't put tension on it. 5. Rinse the area well and dry it gently.  Changing the drainage bag You will change your drainage bag twice a day -in the morning after you shower, change he night bag to the leg bag -at night before you go to bed, change the leg bag to the night bag  Wash your hands thoroughly with soap and water Empty the urine from the drainage bag into the toilet before you change it Pinch off the catheter with your fingers and disconnect the used bag Wipe the end of the catheter using an alcohol pad Wipe the connector on the bag using the second alcohol pad Connect the clean bag to the catheter and release your finger pinch Check all connections.  Straighten any kinks or twits in the tubing  Caring for the Leg bag -always wear the leg bag below your knee.  This will help it drain. -keep the leg bag secure with the velcro straps.  If the straps leave a mark on your leg they are to tight and should be loosened.  Leaving the straps too tight can decrease you circulation and lead to blood clots. -empty the leg bag through the spout at the bottom every  2-4 hours as needed.  Do not let the bag become completely full. -do not lie down for longer than 2 hours while you are wearing the leg bag.  Caring for the Night Bag -always keep the night bag below the level of your bladder . -to hang your night bag while you sleep, place a clean plastic bag inside of a wastebasket.  Hang the night bag on the inside of the wastebasket.  Cleaning the Drainage bag Wash you hands thoroughly with soap and water. Rinse the equipment with cool water.  Do not use hot water it can damage the plastic equipment. Was the equipment with a mild liquid detergent (ivory) and rinse with cool water. To decrease odor, fill the bag halfway with a mixture of 1 part vinegar and 3 parts water. Shake the bag and let it sit for 15 minutes. Rinse the bag with cool water and hang it up to dry.  Preventing infection -keep the drainage bag below the level of your bladder and off the floor at all times. -keep the catheter secured to your thigh to prevent it from moving. -do not lie on or block the flow of urine in the tubing. -shower daily to keep the catheter clean. -clean your hands before and after touching the catheter or bag. -the spout of the drainage bag should never touch the side of  the toilet or any emptying container.  Special Points -You may see some blood or urine around where the catheter enters your body, especially when walking or having a bowel movement.  This is normal, as long as there is urine draining into the drainage bag.  If you experience significant leakage around  catheter tube where it enters your urethra possibly associated with lower abdominal cramping you could be having a bladder spasm.  Please notify your doctor and we can prescribe you a medication to improve your symptoms. -drink 1-2 glasses of liquids every 2 hours while you're awake.  Call your doctor immediately if -your catheter comes out, do not try to replace it yourself -you have  temperature of 101F (38.8C) or higher -you have bright red blood or large blood clots in your urine -you have abdominal pain and no urine in your catheter bag    Activity:  You are encouraged to ambulate frequently (about every hour during waking hours) to help prevent blood clots from forming in your legs or lungs.  However, you should not engage in any heavy lifting (> 5-10 lbs), strenuous activity, or straining.  Diet: You should advance your diet as instructed by your physician.  It will be normal to have some bloating, nausea, and abdominal discomfort intermittently.  Prescriptions:  You will be provided a prescription for pain medication to take as needed.  If your pain is not severe enough to require the prescription pain medication, you may take extra strength Tylenol instead which will have less side effects.  You should also take a prescribed stool softener to avoid straining with bowel movements as the prescription pain medication may constipate you.  Incisions: You may remove your dressing bandages 48 hours after surgery if not removed in the hospital.  You will either have some small staples or special tissue glue at each of the incision sites. Once the bandages are removed (if present), the incisions may stay open to air.  You may start showering (but not soaking or bathing in water) the 2nd day after surgery and the incisions simply need to be patted dry after the shower.  No additional care is needed.  What to call us about: You should call the office if you develop fever > 101 or develop persistent vomiting, redness or draining around your incision, or any other concerning symptoms.    [facility] [address], [address] [place], [place] [address] [phone]

## 2023-06-08 NOTE — Progress Notes (Signed)
Patient is being discharged home to self care. Wife is at bedside to provide transportation home. Patient educated on doctor instruction, foley care, and follow-up appointments. A copy of instructions given to patient. IV has been removed. Patient is ready for discharge.

## 2023-06-08 NOTE — Progress Notes (Signed)
Urology Consult Follow Up  Subjective: POD # 2  Patient sitting up in chair watching TV.  He is feeling good this morning.  He ambulated last night.  He is tolerating diet.  He is passing flatus.   His pain is managed.    VSS afebrile, Good UOP  Anti-infectives: Anti-infectives (From admission, onward)    Start     Dose/Rate Route Frequency Ordered Stop   06/05/23 2245  ceFAZolin (ANCEF) IVPB 2g/100 mL premix        2 g 200 mL/hr over 30 Minutes Intravenous 30 min pre-op 06/05/23 2245 06/06/23 1230       Current Facility-Administered Medications  Medication Dose Route Frequency Provider Last Rate Last Admin   acetaminophen (TYLENOL) tablet 650 mg  650 mg Oral Q4H PRN Sondra Come, MD       dexamethasone (DECADRON) 0.5 MG/5ML solution 2 mg  2 mg Oral BID Sondra Come, MD   2 mg at 06/07/23 2228   heparin injection 5,000 Units  5,000 Units Subcutaneous Q12H Sondra Come, MD   5,000 Units at 06/07/23 2228   HYDROcodone-acetaminophen (NORCO/VICODIN) 5-325 MG per tablet 1-2 tablet  1-2 tablet Oral Q4H PRN Sondra Come, MD   2 tablet at 06/08/23 1610   HYDROmorphone (DILAUDID) injection 0.5-1 mg  0.5-1 mg Intravenous Q2H PRN Sondra Come, MD       ondansetron [facility]) injection 4 mg  4 mg Intravenous Q4H PRN Sondra Come, MD       oxybutynin (DITROPAN) tablet 5 mg  5 mg Oral Q8H PRN Sondra Come, MD       sodium chloride flush (NS) 0.9 % injection 3 mL  3 mL Intravenous Q12H Legrand Rams C, MD   3 mL at 06/07/23 1150   sodium chloride flush (NS) 0.9 % injection 3 mL  3 mL Intravenous PRN Sondra Come, MD         Objective: Vital signs in last 24 hours: Temp:  [97.5 F (36.4 C)-98.3 F (36.8 C)] 97.5 F (36.4 C) (01/29 0755) Pulse Rate:  [64-68] 64 (01/29 0755) Resp:  [16-18] 17 (01/29 0755) BP: (125-140)/(60-76) 140/76 (01/29 0755) SpO2:  [95 %-97 %] 95 % (01/29 0755)  Intake/Output from previous day: 01/28 0701 - 01/29 0700 In: 240  [P.O.:240] Out: 2400 [Urine:2400] Intake/Output this shift: No intake/output data recorded.   Physical Exam Vitals and nursing note reviewed.  Constitutional:      Appearance: Normal appearance.  HENT:     Head: Normocephalic and atraumatic.     Nose: Nose normal.     Mouth/Throat:     Mouth: Mucous membranes are moist.  Eyes:     Extraocular Movements: Extraocular movements intact.     Conjunctiva/sclera: Conjunctivae normal.     Pupils: Pupils are equal, round, and reactive to light.  Pulmonary:     Effort: Pulmonary effort is normal.  Abdominal:     General: There is distension.     Comments: Abdomen with mild distention.  Incisions are clean and dry.  Genitourinary:    Penis: Normal.      Comments: Foley in place draining light pink urine. Musculoskeletal:     Cervical back: Normal range of motion.  Neurological:     Mental Status: He is alert.     Lab Results:  Recent Labs    06/07/23 0610  WBC 12.4*  HGB 14.0  HCT 40.1  PLT 249   BMET Recent Labs  06/07/23 0610  NA 136  K 4.7  CL 103  CO2 26  GLUCOSE 146*  BUN 13  CREATININE 0.84  CALCIUM 9.1   PT/INR No results for input(s): "LABPROT", "INR" in the last 72 hours. ABG No results for input(s): "PHART", "HCO3" in the last 72 hours.  Invalid input(s): "PCO2", "PO2"  Studies/Results: No results found.   Assessment and Plan: 68 year old male who underwent robotic assisted prostatectomy with Dr. Richardo Hanks on January 27 for the management of prostate cancer.  He continues to clinically improve.  His pain is well-controlled.  He is passing flatus.  He is tolerating diet.  He is ambulating.  Plan: -Continue ambulation -He states he would like to see how his regular diet at breakfast as tolerated this morning and ambulate a bit more this morning with his goal to be discharged this afternoon -Will reassess in the afternoon, but anticipate he will be ready for discharge   LOS: 0 days     [facility] 06/08/2023

## 2023-06-08 NOTE — Plan of Care (Signed)
Problem: Education: Goal: Knowledge of the procedure and recovery process will improve Outcome: Progressing   Problem: Bowel/Gastric: Goal: Gastrointestinal status for postoperative course will improve Outcome: Progressing   Problem: Pain Management: Goal: General experience of comfort will improve Outcome: Progressing   Problem: Skin Integrity: Goal: Demonstration of wound healing without infection will improve Outcome: Progressing   Problem: Urinary Elimination: Goal: Ability to avoid or minimize complications of infection will improve Outcome: Progressing Goal: Ability to achieve and maintain urine output will improve Outcome: Progressing Goal: Home care management will improve Outcome: Progressing   Problem: Education: Goal: Knowledge of General Education information will improve Description: Including pain rating scale, medication(s)/side effects and non-pharmacologic comfort measures Outcome: Progressing   Problem: Health Behavior/Discharge Planning: Goal: Ability to manage health-related needs will improve Outcome: Progressing   Problem: Clinical Measurements: Goal: Ability to maintain clinical measurements within normal limits will improve Outcome: Progressing Goal: Will remain free from infection Outcome: Progressing Goal: Diagnostic test results will improve Outcome: Progressing Goal: Respiratory complications will improve Outcome: Progressing Goal: Cardiovascular complication will be avoided Outcome: Progressing   Problem: Activity: Goal: Risk for activity intolerance will decrease Outcome: Progressing   Problem: Nutrition: Goal: Adequate nutrition will be maintained Outcome: Progressing   Problem: Coping: Goal: Level of anxiety will decrease Outcome: Progressing   Problem: Elimination: Goal: Will not experience complications related to bowel motility Outcome: Progressing Goal: Will not experience complications related to urinary  retention Outcome: Progressing   Problem: Pain Managment: Goal: General experience of comfort will improve and/or be controlled Outcome: Progressing   Problem: Safety: Goal: Ability to remain free from injury will improve Outcome: Progressing   Problem: Skin Integrity: Goal: Risk for impaired skin integrity will decrease Outcome: Progressing

## 2023-06-09 NOTE — Progress Notes (Signed)
Catheter Removal  Patient underwent a robotic prostatectomy on 06/06/2023.  He post procedure course was as expected and uneventful.  Surgical pathology still pending.  His incisions are clean and dry.    Patient is present today for a catheter removal.  10 ml of water was drained from the balloon. A 20 FR foley cath was removed from the bladder, no complications were noted. Patient tolerated well.  Performed by: Michiel Cowboy, PA-C   Follow up/ Additional notes:  Return for follow up in March with Dr. Richardo Hanks.  Gave him the [place]egel exercise handout.  Explained that leaking urine is pretty common especially after Foley catheter is first removed.  Hopefully, this will improve over time.  I encouraged him to continue good fluid intake, avoid any strenuous activity (no lifting things over 25 pounds) and to continue tadalafil 5 mg daily for penile health.

## 2023-06-13 ENCOUNTER — Encounter: Payer: Self-pay | Admitting: Urology

## 2023-06-13 ENCOUNTER — Ambulatory Visit: Payer: Commercial Managed Care - PPO | Admitting: Urology

## 2023-06-13 DIAGNOSIS — C61 Malignant neoplasm of prostate: Secondary | ICD-10-CM

## 2023-06-13 LAB — SURGICAL PATHOLOGY

## 2023-06-13 NOTE — Patient Instructions (Signed)
Kegel Exercises  Kegel exercises can help strengthen your pelvic floor muscles. The pelvic floor is a group of muscles that support your rectum, small intestine, and bladder. In females, pelvic floor muscles also help support the uterus. These muscles help you control the flow of urine and stool (feces). Kegel exercises are painless and simple. They do not require any equipment. Your provider may suggest Kegel exercises to: Improve bladder and bowel control. Improve sexual response. Improve weak pelvic floor muscles after surgery to remove the uterus (hysterectomy) or after pregnancy, in females. Improve weak pelvic floor muscles after prostate gland removal or surgery, in males. Kegel exercises involve squeezing your pelvic floor muscles. These are the same muscles you squeeze when you try to stop the flow of urine or keep from passing gas. The exercises can be done while sitting, standing, or lying down, but it is best to vary your position. Ask your health care provider which exercises are safe for you. Do exercises exactly as told by your health care provider and adjust them as directed. Do not begin these exercises until told by your health care provider. Exercises How to do Kegel exercises: Squeeze your pelvic floor muscles tight. You should feel a tight lift in your rectal area. If you are a male, you should also feel a tightness in your vaginal area. Keep your stomach, buttocks, and legs relaxed. Hold the muscles tight for up to 10 seconds. Breathe normally. Relax your muscles for up to 10 seconds. Repeat as told by your health care provider. Repeat this exercise daily as told by your health care provider. Continue to do this exercise for at least 4-6 weeks, or for as long as told by your health care provider. You may be referred to a physical therapist who can help you learn more about how to do Kegel exercises. Depending on your condition, your health care provider may  recommend: Varying how long you squeeze your muscles. Doing several sets of exercises every day. Doing exercises for several weeks. Making Kegel exercises a part of your regular exercise routine. This information is not intended to replace advice given to you by your health care provider. Make sure you discuss any questions you have with your health care provider. Document Revised: 09/04/2020 Document Reviewed: 09/04/2020 Elsevier Patient Education  2024 [employer].  

## 2023-07-19 ENCOUNTER — Ambulatory Visit (INDEPENDENT_AMBULATORY_CARE_PROVIDER_SITE_OTHER): Payer: Commercial Managed Care - PPO | Admitting: Urology

## 2023-07-19 ENCOUNTER — Other Ambulatory Visit
Admission: RE | Admit: 2023-07-19 | Discharge: 2023-07-19 | Disposition: A | Discharge status: Home / Self Care | Attending: Urology | Admitting: Urology

## 2023-07-19 ENCOUNTER — Encounter: Payer: Self-pay | Admitting: Urology

## 2023-07-19 VITALS — BP 162/78 | Ht 73.0 in | Wt 249.0 lb

## 2023-07-19 DIAGNOSIS — R972 Elevated prostate specific antigen [PSA]: Secondary | ICD-10-CM | POA: Diagnosis present

## 2023-07-19 DIAGNOSIS — C61 Malignant neoplasm of prostate: Secondary | ICD-10-CM

## 2023-07-19 LAB — PSA: Prostatic Specific Antigen: <0.01 ng/mL (ref 0.00–4.00)

## 2023-07-19 NOTE — Progress Notes (Signed)
   07/19/2023 8:30 AM   Brandon Shields. 10-06-1955 [id number]  Reason for visit: Follow up prostate cancer status post robotic prostatectomy  HPI: 68 year old male who presented with an elevated PSA of 5.3, abnormal prostate MRI, and biopsy showed high risk prostate cancer, as well as a large median lobe.  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  He underwent robotic radical prostatectomy on 06/06/2023, the case was very challenging secondary to a large, asymmetric, and irregular median lobe.  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  Initial postop PSA has not yet been drawn.  He denies any abdominal pain, tolerating general diet, no gross hematuria or dysuria.  Stress incontinence has improved over the last few weeks, still requiring 3 pads during the day, 1 depends overnight primarily for safety.  Has nocturia 3 times overnight.  Has avoided bladder irritants like coffee.  He does feel things are improving.  No erections since surgery, taking daily Cialis.  Pathology was reviewed including his high risk and ductal features and need for close PSA monitoring moving forward, as well as potential need for additional treatments like radiation and ADT.  Reviewed Kegel exercises.  PSA today, call with results, if low/undetectable repeat in 2 to 3 months    Sondra Come, MD  [facility] [address], [address] [place], [place] [address] [phone]

## 2023-07-19 NOTE — Patient Instructions (Signed)
 Kegel Exercises  Kegel exercises can help strengthen your pelvic floor muscles. The pelvic floor is a group of muscles that support your rectum, small intestine, and bladder. In females, pelvic floor muscles also help support the uterus. These muscles help you control the flow of urine and stool (feces). Kegel exercises are painless and simple. They do not require any equipment. Your provider may suggest Kegel exercises to: Improve bladder and bowel control. Improve sexual response. Improve weak pelvic floor muscles after surgery to remove the uterus (hysterectomy) or after pregnancy, in females. Improve weak pelvic floor muscles after prostate gland removal or surgery, in males. Kegel exercises involve squeezing your pelvic floor muscles. These are the same muscles you squeeze when you try to stop the flow of urine or keep from passing gas. The exercises can be done while sitting, standing, or lying down, but it is best to vary your position. Ask your health care provider which exercises are safe for you. Do exercises exactly as told by your health care provider and adjust them as directed. Do not begin these exercises until told by your health care provider. Exercises How to do Kegel exercises: Squeeze your pelvic floor muscles tight. You should feel a tight lift in your rectal area. If you are a male, you should also feel a tightness in your vaginal area. Keep your stomach, buttocks, and legs relaxed. Hold the muscles tight for up to 10 seconds. Breathe normally. Relax your muscles for up to 10 seconds. Repeat as told by your health care provider. Repeat this exercise daily as told by your health care provider. Continue to do this exercise for at least 4-6 weeks, or for as long as told by your health care provider. You may be referred to a physical therapist who can help you learn more about how to do Kegel exercises. Depending on your condition, your health care provider may  recommend: Varying how long you squeeze your muscles. Doing several sets of exercises every day. Doing exercises for several weeks. Making Kegel exercises a part of your regular exercise routine. This information is not intended to replace advice given to you by your health care provider. Make sure you discuss any questions you have with your health care provider. Document Revised: 09/04/2020 Document Reviewed: 09/04/2020 Elsevier Patient Education  2024 [employer]    Nocturia refers to the need to wake up during the night to urinate, which can disrupt your sleep and impact your overall well-being. Fortunately, there are several strategies you can employ to help prevent or manage nocturia. It's important to consult with your healthcare provider before making any significant changes to your routine. Here are some helpful strategies to consider:  Limit Fluid Intake Before Bed: Avoid drinking large amounts of fluids in the evening, especially within a few hours of bedtime. Consume most of your daily fluid intake earlier in the day to reduce the need to urinate at night.  Monitor Your Diet: Limit your intake of caffeine and alcohol, as these substances can increase urine production and irritate the bladder.  Avoid diet, "zero calorie," and artificially sweetened drinks, especially sodas, in the afternoon or evening. Be mindful of consuming foods and drinks with high water content before bedtime, such as watermelon and herbal teas.  Time Your Medications: If you're taking medications that contribute to increased urination, consult your healthcare provider about adjusting the timing of these medications to minimize their impact during the night.  Practice Double Voiding: Before going to bed, make an  effort to empty your bladder twice within a short period. This can help reduce the amount of urine left in your bladder before sleep.  Bladder Training: Gradually increase the time between  bathroom visits during the day to train your bladder to hold larger volumes of urine. Over time, this can help reduce the frequency of nighttime awakenings to urinate.  Elevate Your Legs During the Day: Elevating your legs during the day can help minimize fluid retention in your lower extremities, which might reduce nighttime urination.  Pelvic Floor Exercises: Strengthening your pelvic floor muscles through Kegel exercises can help improve bladder control and potentially reduce the urge to urinate at night.  Create a Relaxing Bedtime Routine: Stress and anxiety can exacerbate nocturia. Engage in calming activities before bed, such as reading, listening to soothing music, or practicing relaxation techniques.  Stay Active: Engage in regular physical activity, but avoid intense exercise close to bedtime, as this can increase your body's demand for fluids.  Maintain a Healthy Weight: Excess weight can compress the bladder and contribute to bladder and urinary issues. Aim to achieve and maintain a healthy weight through a balanced diet and regular exercise.  Remember that every individual is unique, and the effectiveness of these strategies may vary. It's important to work with your healthcare provider to develop a plan that suits your specific needs and addresses any underlying causes of nocturia.

## 2023-07-20 DIAGNOSIS — C61 Malignant neoplasm of prostate: Secondary | ICD-10-CM

## 2023-07-20 NOTE — Telephone Encounter (Signed)
 Appointment scheduled. PSA ordered.

## 2024-01-24 ENCOUNTER — Ambulatory Visit: Admitting: Urology

## 2024-01-24 VITALS — BP 143/72 | HR 66 | Wt 260.0 lb

## 2024-01-24 DIAGNOSIS — C61 Malignant neoplasm of prostate: Secondary | ICD-10-CM | POA: Diagnosis not present

## 2024-01-24 DIAGNOSIS — N5231 Erectile dysfunction following radical prostatectomy: Secondary | ICD-10-CM | POA: Diagnosis not present

## 2024-01-24 MED ORDER — TADALAFIL 5 MG PO TABS: 5.0000 mg | ORAL_TABLET | Freq: Every day | ORAL | 11 refills | Status: AC | PRN

## 2024-01-24 NOTE — Addendum Note (Signed)
 Addended by: ELOUISE SANTA BROCKS on: 01/24/2024 10:28 AM   Modules accepted: Orders

## 2024-01-24 NOTE — Progress Notes (Signed)
   01/24/2024 9:12 AM   Brandon Shields. [birth year] [id number]  Reason for visit: Follow up prostate cancer, ED, stress incontinence  History: Robotic radical prostatectomy 06/06/2023-> pathology Gleason score 4+4= 8, ductal features present, margins negative Initial postop PSA March 2025 undetectable Had moderate to severe ED preop, had never tried medications  Physical Exam: BP (!) 143/72 (BP Location: Left Arm, Patient Position: Sitting, Cuff Size: Large)   Pulse 66   Wt 260 lb (117.9 kg)   SpO2 96%   BMI 34.30 kg/m   Imaging/labs: Reportedly had a PSA last week with PCP, will obtain those records  Today: Overall doing well Using 2 pads per day which continues to improve No improvement with the ED on daily low-dose Cialis   Plan:   Will obtain PSA from last week with PCP.  We discussed the importance of close monitoring of PSA with his high risk disease and ductal features, possible need for radiation/ADT if biochemical recurrence in the future Consider other ED treatments at next follow-up visit when > 1 year from surgery RTC 6 months PSA prior, will obtain recent PSA from last week with PCP   Brandon JAYSON Burnet, MD  [facility] [address] [place], [place] [address] [phone]

## 2024-02-07 ENCOUNTER — Encounter: Payer: Self-pay | Admitting: Urology

## 2024-02-17 ENCOUNTER — Telehealth: Payer: Self-pay

## 2024-02-17 NOTE — Telephone Encounter (Signed)
 Sent MyChart message to call the clinic and make an appt for a lab to have PSA drawn in the next couple weeks. He will be called with results after.

## 2024-02-22 ENCOUNTER — Ambulatory Visit: Payer: Self-pay | Admitting: Urology

## 2024-02-22 ENCOUNTER — Other Ambulatory Visit
Admission: RE | Admit: 2024-02-22 | Discharge: 2024-02-22 | Disposition: A | Discharge status: Home / Self Care | Attending: Urology | Admitting: Urology

## 2024-02-22 DIAGNOSIS — C61 Malignant neoplasm of prostate: Secondary | ICD-10-CM | POA: Diagnosis present

## 2024-02-22 LAB — PSA: Prostatic Specific Antigen: <0.02 ng/mL (ref 0.00–4.00)

## 2024-07-24 ENCOUNTER — Ambulatory Visit: Admitting: Urology

## 2024-07-31 ENCOUNTER — Ambulatory Visit: Admitting: Urology
